# Patient Record
Sex: Female | Born: 1965 | Race: White | Hispanic: No | Marital: Married | Smoking: Never smoker
Health system: Southern US, Community
[De-identification: ages and names within clinical notes are randomized; demographics above are authoritative.]

## PROBLEM LIST (undated history)

## (undated) DIAGNOSIS — O009 Unspecified ectopic pregnancy without intrauterine pregnancy: Secondary | ICD-10-CM

## (undated) DIAGNOSIS — K439 Ventral hernia without obstruction or gangrene: Secondary | ICD-10-CM

## (undated) DIAGNOSIS — I10 Essential (primary) hypertension: Secondary | ICD-10-CM

## (undated) DIAGNOSIS — K219 Gastro-esophageal reflux disease without esophagitis: Secondary | ICD-10-CM

## (undated) DIAGNOSIS — R112 Nausea with vomiting, unspecified: Secondary | ICD-10-CM

## (undated) DIAGNOSIS — Z9889 Other specified postprocedural states: Secondary | ICD-10-CM

## (undated) DIAGNOSIS — K810 Acute cholecystitis: Secondary | ICD-10-CM

---

## 1987-07-07 DIAGNOSIS — O009 Unspecified ectopic pregnancy without intrauterine pregnancy: Secondary | ICD-10-CM

## 1987-07-07 HISTORY — DX: Unspecified ectopic pregnancy without intrauterine pregnancy: O00.90

## 1989-07-06 HISTORY — PX: ABDOMINOPLASTY: SUR9

## 1998-02-05 ENCOUNTER — Other Ambulatory Visit: Admission: RE | Admit: 1998-02-05 | Discharge: 1998-02-05 | Payer: Self-pay | Admitting: Obstetrics and Gynecology

## 1999-04-24 ENCOUNTER — Other Ambulatory Visit: Admission: RE | Admit: 1999-04-24 | Discharge: 1999-04-24 | Payer: Self-pay | Admitting: *Deleted

## 2000-04-28 ENCOUNTER — Encounter: Admission: RE | Admit: 2000-04-28 | Discharge: 2000-05-17 | Payer: Self-pay | Admitting: Family Medicine

## 2000-05-14 ENCOUNTER — Other Ambulatory Visit: Admission: RE | Admit: 2000-05-14 | Discharge: 2000-05-14 | Payer: Self-pay | Admitting: *Deleted

## 2001-05-26 ENCOUNTER — Other Ambulatory Visit: Admission: RE | Admit: 2001-05-26 | Discharge: 2001-05-26 | Payer: Self-pay | Admitting: *Deleted

## 2001-05-27 ENCOUNTER — Encounter: Admission: RE | Admit: 2001-05-27 | Discharge: 2001-05-27 | Payer: Self-pay | Admitting: Family Medicine

## 2001-05-27 ENCOUNTER — Encounter: Payer: Self-pay | Admitting: Family Medicine

## 2002-05-29 ENCOUNTER — Other Ambulatory Visit: Admission: RE | Admit: 2002-05-29 | Discharge: 2002-05-29 | Payer: Self-pay | Admitting: Obstetrics and Gynecology

## 2003-10-15 ENCOUNTER — Other Ambulatory Visit: Admission: RE | Admit: 2003-10-15 | Discharge: 2003-10-15 | Payer: Self-pay | Admitting: Obstetrics & Gynecology

## 2006-07-06 DIAGNOSIS — K439 Ventral hernia without obstruction or gangrene: Secondary | ICD-10-CM

## 2006-07-06 HISTORY — PX: VENTRAL HERNIA REPAIR: SHX424

## 2006-07-06 HISTORY — DX: Ventral hernia without obstruction or gangrene: K43.9

## 2006-07-07 ENCOUNTER — Encounter: Admission: RE | Admit: 2006-07-07 | Discharge: 2006-07-07 | Payer: Self-pay | Admitting: Obstetrics and Gynecology

## 2007-03-11 ENCOUNTER — Ambulatory Visit (HOSPITAL_COMMUNITY): Admission: RE | Admit: 2007-03-11 | Discharge: 2007-03-11 | Payer: Self-pay | Admitting: *Deleted

## 2009-06-21 ENCOUNTER — Encounter: Admission: RE | Admit: 2009-06-21 | Discharge: 2009-06-21 | Payer: Self-pay | Admitting: Obstetrics

## 2010-07-27 ENCOUNTER — Encounter: Payer: Self-pay | Admitting: Obstetrics

## 2010-11-18 NOTE — Op Note (Signed)
Wendy Yoder, Wendy Yoder                 ACCOUNT NO.:  [id number]   MEDICAL RECORD NO.:  [id number]          PATIENT TYPE:  AMB   LOCATION:  DAY                          FACILITY:  [facility]   PHYSICIAN:  Alfonse Ras, MD   DATE OF BIRTH:  [birth year]   DATE OF PROCEDURE:  03/11/2007  DATE OF DISCHARGE:                               OPERATIVE REPORT   PREOPERATIVE DIAGNOSIS:  Ventral hernia.   POSTOPERATIVE DIAGNOSIS:  Ventral hernia.   PROCEDURE:  Ventral hernia repair with mesh.   SURGEON:  Baruch Merl, MD   ANESTHESIA:  General.   DESCRIPTION:  The patient was taken to the operating room and placed in  a supine position.  After adequate general anesthesia was induced using  endotracheal tube, the abdomen was prepped and draped in a normal  sterile fashion.  Using a vertical upper midline incision, I dissected  down to the hernia sac; this was excised down to the fascia.  There was  a significant amount of omental contents which were reduced back into  the abdomen.  The fascial defect was identified, was only about 2-cm  long and was closed primarily with interrupted figure-of-eight #1  Surgilons.  A piece of 3 x 3 Prolene mesh was then placed over the  repair and tacked about 2 cm outside the repair without difficulty with  a running 2-0 Prolene suture.  The wound was copiously irrigated with  saline.  The skin was closed with a subcuticular 4-0 Monocryl.  Steri-  Strips and sterile dressings were applied.  The patient tolerated the  procedure well and went to PACU in good condition.      Alfonse Ras, MD  Electronically Signed     KRE/MEDQ  D:  03/11/2007  T:  03/11/2007  Job:  [id number]

## 2011-04-17 LAB — PREGNANCY, URINE: Preg Test, Ur: NEGATIVE

## 2011-10-25 ENCOUNTER — Ambulatory Visit: Payer: 59

## 2011-10-25 ENCOUNTER — Ambulatory Visit (INDEPENDENT_AMBULATORY_CARE_PROVIDER_SITE_OTHER): Payer: 59 | Admitting: Family Medicine

## 2011-10-25 VITALS — BP 108/73 | HR 80 | Temp 98.3°F | Resp 16 | Ht 63.25 in | Wt 201.2 lb

## 2011-10-25 DIAGNOSIS — R29898 Other symptoms and signs involving the musculoskeletal system: Secondary | ICD-10-CM

## 2011-10-25 DIAGNOSIS — S8990XA Unspecified injury of unspecified lower leg, initial encounter: Secondary | ICD-10-CM

## 2011-10-25 NOTE — Progress Notes (Signed)
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  Since that time, she has felt that her right leg might give out on her, particularly when she bends and stoops.  There is no back pain, sensory change, or lower leg tenderness, although the outside of the right heel is sore with foot dorsiflexion. Took aleve initially  O: NAD No bony abnormality Full knee, ankle and foot ROM Normal reflexes and sensory check to gross touch. Nontender anterior shin or malleoli. No ecchymosis. [facility] reading (PRIMARY) by  Dr. Milus Glazier:  tib fib and ankle on right: .neg A: right leg strain P:  Given exercises Offered PT. 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

## 2013-01-30 ENCOUNTER — Other Ambulatory Visit: Payer: Self-pay

## 2013-01-30 DIAGNOSIS — Z1231 Encounter for screening mammogram for malignant neoplasm of breast: Secondary | ICD-10-CM

## 2013-02-07 ENCOUNTER — Ambulatory Visit
Admission: RE | Admit: 2013-02-07 | Discharge: 2013-02-07 | Disposition: A | Discharge status: Home / Self Care | Payer: 59 | Source: Ambulatory Visit

## 2013-02-07 DIAGNOSIS — Z1231 Encounter for screening mammogram for malignant neoplasm of breast: Secondary | ICD-10-CM

## 2013-04-18 ENCOUNTER — Encounter (HOSPITAL_COMMUNITY): Payer: Self-pay | Admitting: Emergency Medicine

## 2013-04-18 ENCOUNTER — Observation Stay (HOSPITAL_COMMUNITY)
Admission: EM | Admit: 2013-04-18 | Discharge: 2013-04-20 | Disposition: A | Discharge status: Home / Self Care | Payer: 59 | Attending: General Surgery | Admitting: General Surgery

## 2013-04-18 ENCOUNTER — Emergency Department (HOSPITAL_COMMUNITY): Payer: 59

## 2013-04-18 ENCOUNTER — Ambulatory Visit (HOSPITAL_COMMUNITY): Payer: 59

## 2013-04-18 DIAGNOSIS — D72829 Elevated white blood cell count, unspecified: Secondary | ICD-10-CM

## 2013-04-18 DIAGNOSIS — K819 Cholecystitis, unspecified: Secondary | ICD-10-CM

## 2013-04-18 DIAGNOSIS — R1013 Epigastric pain: Secondary | ICD-10-CM

## 2013-04-18 DIAGNOSIS — R110 Nausea: Secondary | ICD-10-CM

## 2013-04-18 DIAGNOSIS — K810 Acute cholecystitis: Secondary | ICD-10-CM | POA: Diagnosis present

## 2013-04-18 DIAGNOSIS — K8020 Calculus of gallbladder without cholecystitis without obstruction: Principal | ICD-10-CM | POA: Insufficient documentation

## 2013-04-18 DIAGNOSIS — K8010 Calculus of gallbladder with chronic cholecystitis without obstruction: Secondary | ICD-10-CM | POA: Insufficient documentation

## 2013-04-18 DIAGNOSIS — Z23 Encounter for immunization: Secondary | ICD-10-CM | POA: Insufficient documentation

## 2013-04-18 HISTORY — DX: Acute cholecystitis: K81.0

## 2013-04-18 HISTORY — DX: Other specified postprocedural states: Z98.890

## 2013-04-18 HISTORY — DX: Ventral hernia without obstruction or gangrene: K43.9

## 2013-04-18 HISTORY — DX: Other specified postprocedural states: R11.2

## 2013-04-18 HISTORY — DX: Unspecified ectopic pregnancy without intrauterine pregnancy: O00.90

## 2013-04-18 LAB — CBC WITH DIFFERENTIAL/PLATELET
Eosinophils Relative: 0 % (ref 0–5)
HCT: 42.1 % (ref 36.0–46.0)
Hemoglobin: 14.1 g/dL (ref 12.0–15.0)
Lymphocytes Relative: 12 % (ref 12–46)
Lymphs Abs: 2.2 10*3/uL (ref 0.7–4.0)
MCV: 87.2 fL (ref 78.0–100.0)
Monocytes Relative: 4 % (ref 3–12)
Platelets: 341 10*3/uL (ref 150–400)
RBC: 4.83 MIL/uL (ref 3.87–5.11)
WBC: 19.4 10*3/uL — ABNORMAL HIGH (ref 4.0–10.5)

## 2013-04-18 LAB — URINALYSIS, ROUTINE W REFLEX MICROSCOPIC
Bilirubin Urine: NEGATIVE
Protein, ur: NEGATIVE mg/dL
Urobilinogen, UA: 0.2 mg/dL (ref 0.0–1.0)

## 2013-04-18 LAB — COMPREHENSIVE METABOLIC PANEL
ALT: 15 U/L (ref 0–35)
Alkaline Phosphatase: 102 U/L (ref 39–117)
BUN: 10 mg/dL (ref 6–23)
CO2: 24 mEq/L (ref 19–32)
Calcium: 9.7 mg/dL (ref 8.4–10.5)
GFR calc Af Amer: >90 mL/min (ref >90)
GFR calc non Af Amer: >90 mL/min (ref >90)
Glucose, Bld: 102 mg/dL — ABNORMAL HIGH (ref 70–99)
Sodium: 133 mEq/L — ABNORMAL LOW (ref 135–145)

## 2013-04-18 LAB — URINE MICROSCOPIC-ADD ON

## 2013-04-18 MED ORDER — IOHEXOL 300 MG/ML  SOLN
50.0000 mL | Freq: Once | INTRAMUSCULAR | Status: AC | PRN
  Administered 2013-04-18: 50 mL via ORAL

## 2013-04-18 MED ORDER — MORPHINE SULFATE 2 MG/ML IJ SOLN
2.0000 mg | Freq: Once | INTRAMUSCULAR | Status: AC
  Administered 2013-04-18: 2 mg via INTRAVENOUS

## 2013-04-18 MED ORDER — PIPERACILLIN-TAZOBACTAM 3.375 G IVPB
3.3750 g | Freq: Three times a day (TID) | INTRAVENOUS | Status: DC
  Administered 2013-04-18 – 2013-04-20 (×5): 3.375 g via INTRAVENOUS
  Filled 2013-04-18 (×7): qty 50

## 2013-04-18 MED ORDER — POTASSIUM CHLORIDE IN NACL 20-0.9 MEQ/L-% IV SOLN
INTRAVENOUS | Status: DC
  Administered 2013-04-18 – 2013-04-19 (×3): via INTRAVENOUS
  Filled 2013-04-18 (×5): qty 1000

## 2013-04-18 MED ORDER — ONDANSETRON HCL 4 MG/2ML IJ SOLN
4.0000 mg | Freq: Once | INTRAMUSCULAR | Status: AC
  Administered 2013-04-18: 4 mg via INTRAVENOUS
  Filled 2013-04-18: qty 2

## 2013-04-18 MED ORDER — DIPHENHYDRAMINE HCL 50 MG/ML IJ SOLN: 12.5000 mg | Freq: Four times a day (QID) | INTRAMUSCULAR | Status: DC | PRN

## 2013-04-18 MED ORDER — ACETAMINOPHEN 650 MG RE SUPP: 650.0000 mg | Freq: Four times a day (QID) | RECTAL | Status: DC | PRN

## 2013-04-18 MED ORDER — ONDANSETRON HCL 4 MG/2ML IJ SOLN
4.0000 mg | Freq: Four times a day (QID) | INTRAMUSCULAR | Status: DC | PRN
  Administered 2013-04-18 – 2013-04-19 (×2): 4 mg via INTRAVENOUS
  Filled 2013-04-18: qty 2

## 2013-04-18 MED ORDER — DIPHENHYDRAMINE HCL 12.5 MG/5ML PO ELIX: 12.5000 mg | ORAL_SOLUTION | Freq: Four times a day (QID) | ORAL | Status: DC | PRN

## 2013-04-18 MED ORDER — IOHEXOL 300 MG/ML  SOLN
100.0000 mL | Freq: Once | INTRAMUSCULAR | Status: AC | PRN
  Administered 2013-04-18: 100 mL via INTRAVENOUS

## 2013-04-18 MED ORDER — ACETAMINOPHEN 325 MG PO TABS: 650.0000 mg | ORAL_TABLET | Freq: Four times a day (QID) | ORAL | Status: DC | PRN

## 2013-04-18 MED ORDER — SODIUM CHLORIDE 0.9 % IV BOLUS (SEPSIS)
1000.0000 mL | Freq: Once | INTRAVENOUS | Status: AC
  Administered 2013-04-18: 1000 mL via INTRAVENOUS

## 2013-04-18 MED ORDER — INFLUENZA VAC SPLIT QUAD 0.5 ML IM SUSP
0.5000 mL | INTRAMUSCULAR | Status: AC
  Filled 2013-04-18 (×2): qty 0.5

## 2013-04-18 MED ORDER — MORPHINE SULFATE 2 MG/ML IJ SOLN
1.0000 mg | INTRAMUSCULAR | Status: DC | PRN
  Administered 2013-04-18 (×3): 4 mg via INTRAVENOUS
  Filled 2013-04-18 (×3): qty 2

## 2013-04-18 MED ORDER — MORPHINE SULFATE 4 MG/ML IJ SOLN
4.0000 mg | Freq: Once | INTRAMUSCULAR | Status: AC
  Administered 2013-04-18: 4 mg via INTRAVENOUS
  Filled 2013-04-18: qty 1

## 2013-04-18 MED ORDER — MORPHINE SULFATE 4 MG/ML IJ SOLN: 6.0000 mg | Freq: Once | INTRAMUSCULAR | Status: DC

## 2013-04-18 MED ORDER — PIPERACILLIN-TAZOBACTAM 3.375 G IVPB
3.3750 g | Freq: Once | INTRAVENOUS | Status: AC
  Administered 2013-04-18: 3.375 g via INTRAVENOUS
  Filled 2013-04-18: qty 50

## 2013-04-18 MED ORDER — GI COCKTAIL ~~LOC~~
30.0000 mL | Freq: Once | ORAL | Status: AC
  Administered 2013-04-18: 30 mL via ORAL
  Filled 2013-04-18: qty 30

## 2013-04-18 MED ORDER — VITAMINS A & D EX OINT
TOPICAL_OINTMENT | CUTANEOUS | Status: AC
  Administered 2013-04-18: 17:00:00
  Filled 2013-04-18: qty 5

## 2013-04-18 MED ORDER — MORPHINE SULFATE 4 MG/ML IJ SOLN
6.0000 mg | Freq: Once | INTRAMUSCULAR | Status: AC
  Administered 2013-04-18: 4 mg via INTRAVENOUS
  Filled 2013-04-18: qty 2

## 2013-04-18 NOTE — Progress Notes (Signed)
   CARE MANAGEMENT ED NOTE 04/18/2013  Patient:  COREY, CAULFIELD   Account Number:  [id number]  Date Initiated:  04/18/2013  Documentation initiated by:  Edd Arbour  Subjective/Objective Assessment:   47 yr old female [facility] pt without pcp listed in EPIC     Subjective/Objective Assessment Detail:     Action/Plan:   Action/Plan Detail:   Cm spoke with pt who states pcp previously Dr [facility] but not sure whom she has been assigned to since this female dr left   Anticipated DC Date:  04/18/2013     Status Recommendation to Physician:   Result of Recommendation:    Other ED Services  Consult Working Plan    DC Planning Services  Other  Outpatient Services - Pt will follow up  PCP issues    Choice offered to / List presented to:            Status of service:  Completed, signed off  ED Comments:   ED Comments Detail:

## 2013-04-18 NOTE — Consult Note (Signed)
Wendy Yoder 1965-07-30  [id number].    Requesting MD: Dr. Patria Mane Chief Complaint/Reason for Consult: Epigastric abdominal pain & nausea HPI:  47 y/o caucasian obese female presents to [facility] with complaints of severe epigastric abdominal pain and nausea since 04/17/13 night.  The pain became was a severe burning pain and radiated to her mid back which is why she presented to the ED.  Notes feeling a little under the weather about a week ago with some minor nausea.  Appetite has been normal, normal BM's.  No other accompanying symptoms including no vomiting/diarrhea, CP/SOB, dizziness, blood in stools.  Noted no relation to food, had stuffed peppers yesterday for dinner without any problems.  Noted only alleviating factor was standing in the warm shower.  Denies excessive NSAID, caffeine, or alcohol use.  No dysphagia, regurgitation, chronic sore throat, hoarseness.  PSH includes ventral hernia repair in 2008 by Dr. Colin Benton, c-section in 1989 for an ectopic pregnancy, abdominoplasty 1991.    CT scan shows abnormal appearance of the GB with GB wall thickening and/or pericholecystic fluid, questionable gallstones.  Enlarged CBD without CBD stones.  Minimal intrahepatic biliary duct prominence.  Mid transverse colon in ventral abdominal hernia without obstruction.  Ultrasound shows gallstones, no evidence of cholecystitis, but dilated CBD.  Her WBC is 19.2, but all of her other labs including LFT's and lipase are normal.  Urine pregnancy was negative.   ROS: All systems reviewed and otherwise negative except for as above  History reviewed. No pertinent family history.  Past Medical History  Diagnosis Date  . Ventral hernia 2008  . Ectopic pregnancy 1989    Past Surgical History  Procedure Laterality Date  . Ventral hernia repair  2008    Dr. Seward Grater  . Cesarean section  1989  . Abdominoplasty  1991    Social History:  reports that she has never smoked. She does not have any smokeless tobacco  history on file. She reports that she does not drink alcohol. Her drug history is not on file.  Allergies:  Allergies  Allergen Reactions  . Latex Rash    Patient said she had a rash that entered her blood stream   . Tape Rash    Patient said she had a rash that entered her blood stream       (Not in a hospital admission)  Blood pressure 131/68, pulse 48, temperature 98 F (36.7 C), temperature source Oral, resp. rate 14, last menstrual period 02/16/2013, SpO2 99.00%. Physical Exam: General: pleasant, obese, WD/WN white female who is laying in bed in NAD HEENT: head is normocephalic, atraumatic.  Sclera are noninjected.  PERRL.  Ears and nose without any masses or lesions.  Mouth is pink and moist Heart: regular, rate, and rhythm.  No obvious murmurs, gallops, or rubs noted.  Palpable pedal pulses bilaterally Lungs: CTAB, no wheezes, rhonchi, or rales noted.  Respiratory effort nonlabored Abd: obese, soft, mild tenderness in the epigastriuim, no tenderness in the any other region of the abdomen, +BS, ventral hernia defect palpated in the upper abdomen under the midline abdominal scar which is nontender, low transverse scar well healed, no masses, or organomegaly MS: all 4 extremities are symmetrical with no cyanosis, clubbing, or edema. Skin: warm and dry with no masses, lesions, or rashes Psych: A&Ox3 with an appropriate affect.  Results for orders placed during the hospital encounter of 04/18/13 (from the past 48 hour(s))  CBC WITH DIFFERENTIAL     Status: Abnormal   Collection Time  04/18/13  8:50 AM      Result Value Range   WBC 19.4 (*) 4.0 - 10.5 K/uL   RBC 4.83  3.87 - 5.11 MIL/uL   Hemoglobin 14.1  12.0 - 15.0 g/dL   HCT 40.9  81.1 - 91.4 %   MCV 87.2  78.0 - 100.0 fL   MCH 29.2  26.0 - 34.0 pg   MCHC 33.5  30.0 - 36.0 g/dL   RDW 78.2  95.6 - 21.3 %   Platelets 341  150 - 400 K/uL   Neutrophils Relative % 84 (*) 43 - 77 %   Neutro Abs 16.3 (*) 1.7 - 7.7 K/uL    Lymphocytes Relative 12  12 - 46 %   Lymphs Abs 2.2  0.7 - 4.0 K/uL   Monocytes Relative 4  3 - 12 %   Monocytes Absolute 0.8  0.1 - 1.0 K/uL   Eosinophils Relative 0  0 - 5 %   Eosinophils Absolute 0.1  0.0 - 0.7 K/uL   Basophils Relative 0  0 - 1 %   Basophils Absolute 0.1  0.0 - 0.1 K/uL  COMPREHENSIVE METABOLIC PANEL     Status: Abnormal   Collection Time    04/18/13  8:50 AM      Result Value Range   Sodium 133 (*) 135 - 145 mEq/L   Potassium 3.7  3.5 - 5.1 mEq/L   Chloride 98  96 - 112 mEq/L   CO2 24  19 - 32 mEq/L   Glucose, Bld 102 (*) 70 - 99 mg/dL   BUN 10  6 - 23 mg/dL   Creatinine, Ser 0.86  0.50 - 1.10 mg/dL   Calcium 9.7  8.4 - 57.8 mg/dL   Total Protein 8.4 (*) 6.0 - 8.3 g/dL   Albumin 3.7  3.5 - 5.2 g/dL   AST 19  0 - 37 U/L   ALT 15  0 - 35 U/L   Alkaline Phosphatase 102  39 - 117 U/L   Total Bilirubin 0.2 (*) 0.3 - 1.2 mg/dL   GFR calc non Af Amer >90  >90 mL/min   GFR calc Af Amer >90  >90 mL/min   Comment: (NOTE)     The eGFR has been calculated using the CKD EPI equation.     This calculation has not been validated in all clinical situations.     eGFR's persistently <90 mL/min signify possible Chronic Kidney     Disease.  LIPASE, BLOOD     Status: None   Collection Time    04/18/13  8:50 AM      Result Value Range   Lipase 21  11 - 59 U/L  URINALYSIS, ROUTINE W REFLEX MICROSCOPIC     Status: Abnormal   Collection Time    04/18/13 11:36 AM      Result Value Range   Color, Urine YELLOW  YELLOW   APPearance CLEAR  CLEAR   Specific Gravity, Urine 1.029  1.005 - 1.030   pH 5.5  5.0 - 8.0   Glucose, UA NEGATIVE  NEGATIVE mg/dL   Hgb urine dipstick MODERATE (*) NEGATIVE   Bilirubin Urine NEGATIVE  NEGATIVE   Ketones, ur 15 (*) NEGATIVE mg/dL   Protein, ur NEGATIVE  NEGATIVE mg/dL   Urobilinogen, UA 0.2  0.0 - 1.0 mg/dL   Nitrite NEGATIVE  NEGATIVE   Leukocytes, UA SMALL (*) NEGATIVE  URINE MICROSCOPIC-ADD ON     Status: Abnormal   Collection Time  04/18/13 11:36 AM      Result Value Range   Squamous Epithelial / LPF FEW (*) RARE   WBC, UA 3-6  <3 WBC/hpf   RBC / HPF 3-6  <3 RBC/hpf   Bacteria, UA MANY (*) RARE   Casts GRANULAR CAST (*) NEGATIVE  POCT PREGNANCY, URINE     Status: None   Collection Time    04/18/13 11:41 AM      Result Value Range   Preg Test, Ur NEGATIVE  NEGATIVE   Comment:            THE SENSITIVITY OF THIS     METHODOLOGY IS >24 mIU/mL   Ct Abdomen Pelvis W Contrast  04/18/2013   CLINICAL DATA:  Peri umbilical pain started last night. History of hernia repair 4 years ago. Prior an ectopic pregnancy 25 years ago.  EXAM: CT ABDOMEN AND PELVIS WITH CONTRAST  TECHNIQUE: Multidetector CT imaging of the abdomen and pelvis was performed using the standard protocol following bolus administration of intravenous contrast.  CONTRAST:  50mL OMNIPAQUE IOHEXOL 300 MG/ML SOLN, OMNIPAQUE IOHEXOL 300 MG/ML SOLN  COMPARISON:  None.  FINDINGS: Lung bases clear.  Abnormal appearance of the gallbladder with gallbladder wall thickening and/or pericholecystic fluid. Suggestion of gallstones. Cholecystitis is a possibility. Ultrasound may be considered for further delineation.  Enlarged common bile duct without calcified common bile duct stone noted nor evidence of pancreatic obstructing lesion.  Minimal intrahepatic biliary duct prominence. Within the left lobe of the liver, there is a 9 mm structure most suggestive of a cyst. Liver is elongated spanning over 22 cm.  The mid transverse colon enters anterior abdominal wall hernia. No obstruction proximal to this level. No extra luminal bowel inflammatory process or free intraperitoneal air.  Non contrast filled slightly underdistended views of the urinary bladder unremarkable. Small follicle/cyst right ovary incidentally noted.  Mild degenerative changes lower lumbar spine. Mild sacroiliac joint degenerative changes.  No abdominal aortic aneurysm. No adenopathy.  IMPRESSION: Abnormal  appearance of the gallbladder with gallbladder wall thickening and/or pericholecystic fluid. Suggestion of gallstones. Cholecystitis is a possibility. Ultrasound may be considered for further delineation.  Enlarged common bile duct without calcified common bile duct stone noted nor evidence of pancreatic obstructing lesion.  Minimal intrahepatic biliary duct prominence. Within the left lobe of the liver, there is a 9 mm structure most suggestive of a cyst. Liver is elongated spanning over 22 cm.  Mid transverse colon enters anterior abdominal wall hernia. No obstruction proximal to this level. No extra luminal bowel inflammatory process or free intraperitoneal air.  These results were called by telephone at the time of interpretation on 04/18/2013 at 1:17 PM to Dr. Patria Mane , who verbally acknowledged these results.   Electronically Signed   By: Bridgett Larsson M.D.   On: 04/18/2013 13:19       Assessment/Plan Epigastric abdominal pain Cholelithiasis Nausea Leukocytosis 19.4 Recurrent ventral hernia s/p repair with mesh in 2008  Plan: 1.  Her clinical picture is not particularly consistent with acute cholecystitis, but she does have GB wall thickening/pericholecystic fluid as evident on CT scan and leukocytosis.  Differential diagnosis could include pancreatitis, however lipase is negative.  LFT's are also normal.  PUD could also be a possibility, but she doesn't have any significant risk factors for it.   2.  US shows stones, but no cholecystitis, will obtain HIDA scan first thing in the AM 3.  Cont NPO, IVF, pain control, antiemetics, antibiotics (zosyn given) 4.  Will admit to our service for further workup and pain control   DORT, MEGAN 04/18/2013, 2:21 PM Pager: [phone]  Somewhat inconsistent exam and findings. Plan HIDA scan to look for cystic duct obstruction. Agree with above.  Ovidio Kin, MD, [facility] Pager: [phone] Office phone:  [phone]

## 2013-04-18 NOTE — ED Notes (Signed)
US at bedside

## 2013-04-18 NOTE — ED Notes (Addendum)
Patient transported to CT 

## 2013-04-18 NOTE — ED Notes (Addendum)
Pt from home c/o of abdominal pain above the umbilicus started last pm.  Hx of Hiatal hernia 4 yrs ago. which was repaired mesh. Pt states the pain radiates to her back and its a burning pain. Hx of ectopic pregnancy 25 yrs ago. She has not period since August. No N/V/D.

## 2013-04-18 NOTE — ED Provider Notes (Signed)
CSN: [id number]     Arrival date & time 04/18/13  4098 History   First MD Initiated Contact with Patient 04/18/13 [phone]     Chief Complaint  Patient presents with  . Abdominal Pain  . Back Pain    HPI Patient reports developing severe epigastric discomfort with radiation through to her back which developed today.  She's had nausea without vomiting.  She denies diarrhea.  No prior history of biliary symptoms.  No prior diagnosis of cholelithiasis.  She denies fevers or chills.  No urinary complaints.  She does not routinely drink alcohol.  She's never had pancreatitis before.  She's otherwise healthy without any significant prior medical issues.  She does have a known ventral hernia which was repaired with mesh.   Past Medical History  Diagnosis Date  . Ventral hernia 2008  . Ectopic pregnancy 1989  . PONV (postoperative nausea and vomiting)    Past Surgical History  Procedure Laterality Date  . Ventral hernia repair  2008    Dr. Seward Grater  . Cesarean section  1989  . Abdominoplasty  1991   History reviewed. No pertinent family history. History  Substance Use Topics  . Smoking status: Never Smoker   . Smokeless tobacco: Never Used  . Alcohol Use: No   OB History   Grav Para Term Preterm Abortions TAB SAB Ect Mult Living   3 1             Review of Systems  All other systems reviewed and are negative.    Allergies  Latex and Tape  Home Medications   Current Outpatient Rx  Name  Route  Sig  Dispense  Refill  . ibuprofen (ADVIL,MOTRIN) 200 MG tablet   Oral   Take 400 mg by mouth every 6 (six) hours as needed for pain.         . norgestimate-ethinyl estradiol (ORTHO-CYCLEN,SPRINTEC,PREVIFEM) 0.25-35 MG-MCG tablet   Oral   Take 1 tablet by mouth daily.         . sodium-potassium bicarbonate (ALKA-SELTZER GOLD) TBEF dissolvable tablet   Oral   Take 1 tablet by mouth daily as needed (heartburn).          BP 150/76  Pulse 64  Temp(Src) 98 F (36.7 C) (Oral)   Resp 16  SpO2 97%  LMP 02/16/2013 Physical Exam  Nursing note and vitals reviewed. Constitutional: She is oriented to person, place, and time. She appears well-developed and well-nourished. No distress.  HENT:  Head: Normocephalic and atraumatic.  Eyes: EOM are normal.  Neck: Normal range of motion.  Cardiovascular: Normal rate, regular rhythm and normal heart sounds.   Pulmonary/Chest: Effort normal and breath sounds normal.  Abdominal: Soft. She exhibits no distension.  Epigastric tenderness without guarding or rebound  Musculoskeletal: Normal range of motion.  Neurological: She is alert and oriented to person, place, and time.  Skin: Skin is warm and dry.  Psychiatric: She has a normal mood and affect. Judgment normal.    ED Course  Procedures (including critical care time) Labs Review Labs Reviewed  CBC WITH DIFFERENTIAL - Abnormal; Notable for the following:    WBC 19.4 (*)    Neutrophils Relative % 84 (*)    Neutro Abs 16.3 (*)    All other components within normal limits  COMPREHENSIVE METABOLIC PANEL - Abnormal; Notable for the following:    Sodium 133 (*)    Glucose, Bld 102 (*)    Total Protein 8.4 (*)    Total  Bilirubin 0.2 (*)    All other components within normal limits  URINALYSIS, ROUTINE W REFLEX MICROSCOPIC - Abnormal; Notable for the following:    Hgb urine dipstick MODERATE (*)    Ketones, ur 15 (*)    Leukocytes, UA SMALL (*)    All other components within normal limits  URINE MICROSCOPIC-ADD ON - Abnormal; Notable for the following:    Squamous Epithelial / LPF FEW (*)    Bacteria, UA MANY (*)    Casts GRANULAR CAST (*)    All other components within normal limits  URINE CULTURE  LIPASE, BLOOD  POCT PREGNANCY, URINE   Ct Abdomen Pelvis W Contrast  04/18/2013   CLINICAL DATA:  Peri umbilical pain started last night. History of hernia repair 4 years ago. Prior an ectopic pregnancy 25 years ago.  EXAM: CT ABDOMEN AND PELVIS WITH CONTRAST   TECHNIQUE: Multidetector CT imaging of the abdomen and pelvis was performed using the standard protocol following bolus administration of intravenous contrast.  CONTRAST:  50mL OMNIPAQUE IOHEXOL 300 MG/ML SOLN, OMNIPAQUE IOHEXOL 300 MG/ML SOLN  COMPARISON:  None.  FINDINGS: Lung bases clear.  Abnormal appearance of the gallbladder with gallbladder wall thickening and/or pericholecystic fluid. Suggestion of gallstones. Cholecystitis is a possibility. Ultrasound may be considered for further delineation.  Enlarged common bile duct without calcified common bile duct stone noted nor evidence of pancreatic obstructing lesion.  Minimal intrahepatic biliary duct prominence. Within the left lobe of the liver, there is a 9 mm structure most suggestive of a cyst. Liver is elongated spanning over 22 cm.  The mid transverse colon enters anterior abdominal wall hernia. No obstruction proximal to this level. No extra luminal bowel inflammatory process or free intraperitoneal air.  Non contrast filled slightly underdistended views of the urinary bladder unremarkable. Small follicle/cyst right ovary incidentally noted.  Mild degenerative changes lower lumbar spine. Mild sacroiliac joint degenerative changes.  No abdominal aortic aneurysm. No adenopathy.  IMPRESSION: Abnormal appearance of the gallbladder with gallbladder wall thickening and/or pericholecystic fluid. Suggestion of gallstones. Cholecystitis is a possibility. Ultrasound may be considered for further delineation.  Enlarged common bile duct without calcified common bile duct stone noted nor evidence of pancreatic obstructing lesion.  Minimal intrahepatic biliary duct prominence. Within the left lobe of the liver, there is a 9 mm structure most suggestive of a cyst. Liver is elongated spanning over 22 cm.  Mid transverse colon enters anterior abdominal wall hernia. No obstruction proximal to this level. No extra luminal bowel inflammatory process or free  intraperitoneal air.  These results were called by telephone at the time of interpretation on 04/18/2013 at 1:17 PM to Dr. Patria Mane , who verbally acknowledged these results.   Electronically Signed   By: Bridgett Larsson M.D.   On: 04/18/2013 13:19    EKG Interpretation   None       MDM   1. Cholecystitis    CT scan with questionable cholecystitis.  Elevated white blood cell count.  IV Zosyn given.  General surgery consultation.  Ultrasound pending at this time    Lyanne Co, MD 04/18/13 [phone]

## 2013-04-18 NOTE — ED Notes (Signed)
Nuclear med called and reports pt needs to be Opoid free for 6 hrs last Opiod admin @  [phone]

## 2013-04-18 NOTE — ED Notes (Signed)
Pt in CT.

## 2013-04-18 NOTE — Progress Notes (Signed)
Pt admitted from ED and resting in bed comfortably with husband at bedside.

## 2013-04-19 ENCOUNTER — Encounter (HOSPITAL_COMMUNITY): Payer: Self-pay

## 2013-04-19 ENCOUNTER — Observation Stay (HOSPITAL_COMMUNITY): Payer: 59 | Admitting: Anesthesiology

## 2013-04-19 ENCOUNTER — Observation Stay (HOSPITAL_COMMUNITY): Payer: 59

## 2013-04-19 ENCOUNTER — Encounter (HOSPITAL_COMMUNITY): Payer: 59 | Admitting: Anesthesiology

## 2013-04-19 ENCOUNTER — Encounter (HOSPITAL_COMMUNITY)
Admission: EM | Disposition: A | Discharge status: Home / Self Care | Payer: Self-pay | Source: Home / Self Care | Attending: Emergency Medicine

## 2013-04-19 DIAGNOSIS — K8010 Calculus of gallbladder with chronic cholecystitis without obstruction: Secondary | ICD-10-CM

## 2013-04-19 HISTORY — PX: CHOLECYSTECTOMY: SHX55

## 2013-04-19 LAB — COMPREHENSIVE METABOLIC PANEL
AST: 16 U/L (ref 0–37)
Albumin: 3.1 g/dL — ABNORMAL LOW (ref 3.5–5.2)
Alkaline Phosphatase: 93 U/L (ref 39–117)
CO2: 27 mEq/L (ref 19–32)
Chloride: 100 mEq/L (ref 96–112)
Creatinine, Ser: 0.9 mg/dL (ref 0.50–1.10)
GFR calc non Af Amer: 77 mL/min — ABNORMAL LOW (ref >90)
Glucose, Bld: 108 mg/dL — ABNORMAL HIGH (ref 70–99)
Potassium: 4.3 mEq/L (ref 3.5–5.1)
Total Bilirubin: 0.4 mg/dL (ref 0.3–1.2)

## 2013-04-19 LAB — CBC
MCH: 29.1 pg (ref 26.0–34.0)
MCHC: 33.1 g/dL (ref 30.0–36.0)
MCV: 88.2 fL (ref 78.0–100.0)
Platelets: 291 10*3/uL (ref 150–400)
RBC: 4.22 MIL/uL (ref 3.87–5.11)
RDW: 14.4 % (ref 11.5–15.5)

## 2013-04-19 LAB — SURGICAL PCR SCREEN
MRSA, PCR: NEGATIVE
Staphylococcus aureus: NEGATIVE

## 2013-04-19 LAB — URINE CULTURE: Colony Count: 5000

## 2013-04-19 LAB — LIPASE, BLOOD: Lipase: 26 U/L (ref 11–59)

## 2013-04-19 SURGERY — LAPAROSCOPIC CHOLECYSTECTOMY WITH INTRAOPERATIVE CHOLANGIOGRAM
Anesthesia: General | Site: Abdomen | Wound class: Clean Contaminated

## 2013-04-19 MED ORDER — PHENOL 1.4 % MT LIQD
1.0000 | OROMUCOSAL | Status: DC | PRN
  Administered 2013-04-19: 1 via OROMUCOSAL
  Filled 2013-04-19: qty 177

## 2013-04-19 MED ORDER — HYDROCODONE-ACETAMINOPHEN 5-325 MG PO TABS
1.0000 | ORAL_TABLET | ORAL | Status: DC | PRN
  Administered 2013-04-20: 2 via ORAL
  Filled 2013-04-19: qty 2

## 2013-04-19 MED ORDER — ONDANSETRON HCL 4 MG/2ML IJ SOLN
INTRAMUSCULAR | Status: AC
  Administered 2013-04-19: 4 mg via INTRAVENOUS
  Filled 2013-04-19: qty 2

## 2013-04-19 MED ORDER — PROMETHAZINE HCL 25 MG/ML IJ SOLN: 6.2500 mg | INTRAMUSCULAR | Status: DC | PRN

## 2013-04-19 MED ORDER — ONDANSETRON HCL 4 MG/2ML IJ SOLN
INTRAMUSCULAR | Status: DC | PRN
  Administered 2013-04-19 (×2): 2 mg via INTRAMUSCULAR

## 2013-04-19 MED ORDER — LIDOCAINE HCL (CARDIAC) 20 MG/ML IV SOLN
INTRAVENOUS | Status: DC | PRN
  Administered 2013-04-19: 75 mg via INTRAVENOUS

## 2013-04-19 MED ORDER — GLYCOPYRROLATE 0.2 MG/ML IJ SOLN
INTRAMUSCULAR | Status: DC | PRN
  Administered 2013-04-19: .4 mg via INTRAVENOUS
  Administered 2013-04-19: 0.2 mg via INTRAVENOUS

## 2013-04-19 MED ORDER — CISATRACURIUM BESYLATE (PF) 10 MG/5ML IV SOLN
INTRAVENOUS | Status: DC | PRN
  Administered 2013-04-19: 6 mg via INTRAVENOUS
  Administered 2013-04-19: 2 mg via INTRAVENOUS

## 2013-04-19 MED ORDER — MIDAZOLAM HCL 5 MG/5ML IJ SOLN
INTRAMUSCULAR | Status: DC | PRN
  Administered 2013-04-19 (×2): 1 mg via INTRAVENOUS

## 2013-04-19 MED ORDER — BUPIVACAINE HCL (PF) 0.25 % IJ SOLN
INTRAMUSCULAR | Status: DC | PRN
  Administered 2013-04-19: 10 mL

## 2013-04-19 MED ORDER — KETOROLAC TROMETHAMINE 30 MG/ML IJ SOLN: 15.0000 mg | Freq: Once | INTRAMUSCULAR | Status: DC | PRN

## 2013-04-19 MED ORDER — HYDROMORPHONE HCL PF 1 MG/ML IJ SOLN: 0.2500 mg | INTRAMUSCULAR | Status: DC | PRN

## 2013-04-19 MED ORDER — DIATRIZOATE MEGLUMINE 30 % UR SOLN
URETHRAL | Status: DC | PRN
  Administered 2013-04-19: 7 mL via URETHRAL

## 2013-04-19 MED ORDER — HYDROMORPHONE HCL PF 1 MG/ML IJ SOLN
INTRAMUSCULAR | Status: DC | PRN
  Administered 2013-04-19 (×2): 1 mg via INTRAVENOUS

## 2013-04-19 MED ORDER — TECHNETIUM TC 99M MEBROFENIN IV KIT
5.1000 | PACK | Freq: Once | INTRAVENOUS | Status: AC | PRN
  Administered 2013-04-19: 5.1 via INTRAVENOUS

## 2013-04-19 MED ORDER — LACTATED RINGERS IV SOLN
INTRAVENOUS | Status: DC | PRN
  Administered 2013-04-19: 17:00:00
  Administered 2013-04-19 (×2): via INTRAVENOUS

## 2013-04-19 MED ORDER — SUCCINYLCHOLINE CHLORIDE 20 MG/ML IJ SOLN
INTRAMUSCULAR | Status: DC | PRN
  Administered 2013-04-19: 120 mg via INTRAVENOUS

## 2013-04-19 MED ORDER — PROPOFOL 10 MG/ML IV BOLUS
INTRAVENOUS | Status: DC | PRN
  Administered 2013-04-19: 200 mg via INTRAVENOUS

## 2013-04-19 MED ORDER — PROPOFOL INFUSION 10 MG/ML OPTIME
INTRAVENOUS | Status: DC | PRN
  Administered 2013-04-19: 100 ug/kg/min via INTRAVENOUS

## 2013-04-19 MED ORDER — SCOPOLAMINE 1 MG/3DAYS TD PT72
MEDICATED_PATCH | TRANSDERMAL | Status: AC
  Filled 2013-04-19: qty 1

## 2013-04-19 MED ORDER — DEXAMETHASONE SODIUM PHOSPHATE 10 MG/ML IJ SOLN
INTRAMUSCULAR | Status: DC | PRN
  Administered 2013-04-19: 10 mg via INTRAVENOUS

## 2013-04-19 MED ORDER — LACTATED RINGERS IV SOLN: INTRAVENOUS | Status: DC

## 2013-04-19 MED ORDER — SCOPOLAMINE 1 MG/3DAYS TD PT72
MEDICATED_PATCH | TRANSDERMAL | Status: DC | PRN
  Administered 2013-04-19: 1 via TRANSDERMAL

## 2013-04-19 MED ORDER — MORPHINE SULFATE 4 MG/ML IJ SOLN
INTRAMUSCULAR | Status: AC
  Administered 2013-04-19: 4 mg via INTRAVENOUS
  Filled 2013-04-19: qty 1

## 2013-04-19 MED ORDER — BUPIVACAINE HCL (PF) 0.25 % IJ SOLN
INTRAMUSCULAR | Status: AC
  Filled 2013-04-19: qty 30

## 2013-04-19 MED ORDER — ACETAMINOPHEN 10 MG/ML IV SOLN
1000.0000 mg | Freq: Once | INTRAVENOUS | Status: AC
  Administered 2013-04-19: 1000 mg via INTRAVENOUS
  Filled 2013-04-19: qty 100

## 2013-04-19 MED ORDER — MORPHINE SULFATE 4 MG/ML IJ SOLN
4.0000 mg | Freq: Once | INTRAMUSCULAR | Status: AC
  Administered 2013-04-19: 4 mg via INTRAVENOUS

## 2013-04-19 MED ORDER — FENTANYL CITRATE 0.05 MG/ML IJ SOLN
INTRAMUSCULAR | Status: DC | PRN
  Administered 2013-04-19 (×3): 50 ug via INTRAVENOUS

## 2013-04-19 MED ORDER — NEOSTIGMINE METHYLSULFATE 1 MG/ML IJ SOLN
INTRAMUSCULAR | Status: DC | PRN
  Administered 2013-04-19: 2 mg via INTRAVENOUS

## 2013-04-19 SURGICAL SUPPLY — 49 items
ADH SKN CLS APL DERMABOND .7 (GAUZE/BANDAGES/DRESSINGS)
APL SKNCLS STERI-STRIP NONHPOA (GAUZE/BANDAGES/DRESSINGS) ×1
APPLIER CLIP ROT 10 11.4 M/L (STAPLE) ×2
APR CLP MED LRG 11.4X10 (STAPLE) ×1
BAG SPEC RTRVL LRG 6X4 10 (ENDOMECHANICALS) ×1
BENZOIN TINCTURE PRP APPL 2/3 (GAUZE/BANDAGES/DRESSINGS) ×2 IMPLANT
CANISTER SUCTION 2500CC (MISCELLANEOUS) ×2 IMPLANT
CHLORAPREP W/TINT 26ML (MISCELLANEOUS) ×2 IMPLANT
CHOLANGIOGRAM CATH TAUT (CATHETERS) ×2 IMPLANT
CLIP APPLIE ROT 10 11.4 M/L (STAPLE) ×1 IMPLANT
CLOTH BEACON ORANGE TIMEOUT ST (SAFETY) ×2 IMPLANT
COVER MAYO STAND STRL (DRAPES) ×1 IMPLANT
DECANTER SPIKE VIAL GLASS SM (MISCELLANEOUS) ×2 IMPLANT
DERMABOND ADVANCED (GAUZE/BANDAGES/DRESSINGS)
DERMABOND ADVANCED .7 DNX12 (GAUZE/BANDAGES/DRESSINGS) IMPLANT
DRAPE C-ARM 42X120 X-RAY (DRAPES) IMPLANT
DRAPE LAPAROSCOPIC ABDOMINAL (DRAPES) ×2 IMPLANT
ELECT REM PT RETURN 9FT ADLT (ELECTROSURGICAL) ×2
ELECTRODE REM PT RTRN 9FT ADLT (ELECTROSURGICAL) ×1 IMPLANT
GLOVE BIOGEL PI IND STRL 6.5 (GLOVE) IMPLANT
GLOVE BIOGEL PI IND STRL 7.0 (GLOVE) ×1 IMPLANT
GLOVE BIOGEL PI IND STRL 7.5 (GLOVE) IMPLANT
GLOVE BIOGEL PI IND STRL 8 (GLOVE) IMPLANT
GLOVE BIOGEL PI INDICATOR 6.5 (GLOVE) ×1
GLOVE BIOGEL PI INDICATOR 7.0 (GLOVE) ×4
GLOVE BIOGEL PI INDICATOR 7.5 (GLOVE) ×1
GLOVE BIOGEL PI INDICATOR 8 (GLOVE) ×1
GLOVE SURG SIGNA 7.5 PF LTX (GLOVE) ×1 IMPLANT
GOWN PREVENTION PLUS LG XLONG (DISPOSABLE) ×2 IMPLANT
GOWN STRL REIN XL XLG (GOWN DISPOSABLE) ×4 IMPLANT
HEMOSTAT SURGICEL 4X8 (HEMOSTASIS) IMPLANT
IV CATH 14GX2 1/4 (CATHETERS) ×2 IMPLANT
IV SET EXT 30 76VOL 4 MALE LL (IV SETS) ×2 IMPLANT
KIT BASIN OR (CUSTOM PROCEDURE TRAY) ×2 IMPLANT
NS IRRIG 1000ML POUR BTL (IV SOLUTION) ×1 IMPLANT
POUCH SPECIMEN RETRIEVAL 10MM (ENDOMECHANICALS) ×1 IMPLANT
SET IRRIG TUBING LAPAROSCOPIC (IRRIGATION / IRRIGATOR) ×2 IMPLANT
SOLUTION ANTI FOG 6CC (MISCELLANEOUS) ×2 IMPLANT
STOPCOCK K 69 2C6206 (IV SETS) ×2 IMPLANT
STRIP CLOSURE SKIN 1/4X4 (GAUZE/BANDAGES/DRESSINGS) ×2 IMPLANT
SUT VIC AB 5-0 PS2 18 (SUTURE) ×2 IMPLANT
TOWEL OR 17X26 10 PK STRL BLUE (TOWEL DISPOSABLE) ×6 IMPLANT
TRAY LAP CHOLE (CUSTOM PROCEDURE TRAY) ×2 IMPLANT
TROCAR BLADELESS OPT 5 100 (ENDOMECHANICALS) ×6 IMPLANT
TROCAR XCEL BLUNT TIP 100MML (ENDOMECHANICALS) ×2 IMPLANT
TROCAR XCEL NON-BLD 11X100MML (ENDOMECHANICALS) ×2 IMPLANT
TROCAR XCEL UNIV SLVE 11M 100M (ENDOMECHANICALS) IMPLANT
TUBING INSUFFLATION 10FT LAP (TUBING) ×2 IMPLANT
WATER STERILE IRR 1500ML POUR (IV SOLUTION) ×2 IMPLANT

## 2013-04-19 NOTE — Anesthesia Preprocedure Evaluation (Signed)
Anesthesia Evaluation  Patient identified by MRN, date of birth, ID band Patient awake    Reviewed: Allergy & Precautions, H&P , NPO status , Patient's Chart, lab work & pertinent test results  Airway Mallampati: II TM Distance: <3 FB Neck ROM: Full    Dental no notable dental hx.    Pulmonary neg pulmonary ROS,  breath sounds clear to auscultation  Pulmonary exam normal       Cardiovascular negative cardio ROS  Rhythm:Regular Rate:Normal     Neuro/Psych negative neurological ROS  negative psych ROS   GI/Hepatic negative GI ROS, Neg liver ROS,   Endo/Other  Morbid obesity  Renal/GU negative Renal ROS  negative genitourinary   Musculoskeletal negative musculoskeletal ROS (+)   Abdominal   Peds negative pediatric ROS (+)  Hematology negative hematology ROS (+)   Anesthesia Other Findings   Reproductive/Obstetrics negative OB ROS                           Anesthesia Physical Anesthesia Plan  ASA: II  Anesthesia Plan: General   Post-op Pain Management:    Induction: Intravenous  Airway Management Planned: Oral ETT  Additional Equipment:   Intra-op Plan:   Post-operative Plan: Extubation in OR  Informed Consent: I have reviewed the patients History and Physical, chart, labs and discussed the procedure including the risks, benefits and alternatives for the proposed anesthesia with the patient or authorized representative who has indicated his/her understanding and acceptance.   Dental advisory given  Plan Discussed with: CRNA and Surgeon  Anesthesia Plan Comments:         Anesthesia Quick Evaluation  

## 2013-04-19 NOTE — Anesthesia Procedure Notes (Signed)
Procedure Name: Intubation Date/Time: 04/19/2013 2:52 PM Performed by: Edison Pace Pre-anesthesia Checklist: Emergency Drugs available, Patient identified, Timeout performed, Suction available and Patient being monitored Patient Re-evaluated:Patient Re-evaluated prior to inductionOxygen Delivery Method: Circle system utilized Preoxygenation: Pre-oxygenation with 100% oxygen Intubation Type: IV induction and Rapid sequence Laryngoscope Size: Mac and 3 Grade View: Grade II Tube type: Oral Tube size: 7.5 mm Number of attempts: 1 Airway Equipment and Method: Stylet Placement Confirmation: ETT inserted through vocal cords under direct vision,  positive ETCO2 and breath sounds checked- equal and bilateral Secured at: 21 cm Tube secured with: Tape Dental Injury: Teeth and Oropharynx as per pre-operative assessment

## 2013-04-19 NOTE — Progress Notes (Signed)
Clinical Social Work  CSW received referral to assist with advanced directives. CSW attempted to meet with patient but patient out of room for a procedure. CSW will follow up at later time.  [place], [place] [phone]

## 2013-04-19 NOTE — Op Note (Signed)
04/18/2013 - 04/19/2013  4:21 PM  PATIENT:  Wendy Yoder, 47 y.o., female, MRN: [id number]  PREOP DIAGNOSIS:  gallstones  POSTOP DIAGNOSIS:   Acute cholecystitis, cholelithiasis, impacted gall stone  PROCEDURE:   Procedure(s): LAPAROSCOPIC CHOLECYSTECTOMY WITH INTRAOPERATIVE CHOLANGIOGRAM  SURGEON:   Ovidio Kin, M.D.  ASSISTANTGordy Savers, M.D.  ANESTHESIA:   general  Anesthesiologist: Eilene Ghazi, MD CRNA: Edison Pace, CRNA  General  ASA: @asa @  EBL:  minimal  ml  BLOOD ADMINISTERED: none  DRAINS: none   LOCAL MEDICATIONS USED:   30 cc 1/4% marcaine  SPECIMEN:   Gall bladder  COUNTS CORRECT:  YES  INDICATIONS FOR PROCEDURE:  Wendy Yoder is a 47 y.o. (DOB: 1965-11-13) white  female whose primary care physician is Provider Not In System and comes for cholecystectomy.   The indications and risks of the gall bladder surgery were explained to the patient.  The risks include, but are not limited to, infection, bleeding, common bile duct injury and open surgery.  SURGERY:  The patient was taken to room #[facility] at [facility].  The abdomen was prepped with chloroprep.  The patient was already on Zosyn.   A time out was held and the surgical checklist run.   An infraumbilical incision was made into the abdominal cavity.  A 12 mm Hasson trocar was inserted into the abdominal cavity through the infraumbilical incision and secured with a 0 Vicryl suture.  Three additional trocars were inserted: a 10 mm trocar in the sub-xiphoid location, a 5 mm trocar in the right mid subcostal area, and a 5 mm trocar in the right lateral subcostal area.   The abdomen was explored and the liver, stomach, and bowel that could be seen were unremarkable.  The patient had a hernia in her upper abdomen with apparent colon going into the hernia.  I left the hernia and the involved bowel alone.  The gall bladder was distended.  I attempted to decompress the gall bladder with the Nizhat sucker, the most of  the gall bladder distention was due to gall stones.   The gall bladder was identified, grasped, and rotated cephalad.  Disssection was carried down to the gall bladder/cystic duct junction and the cystic duct isolated.  A clip was placed on the gall bladder side of the cystic duct.   An intra-operative cholangiogram was shot.   The intra-operative cholangiogram was shot using a cut off Taut catheter placed through a 14 gauge angiocath in the RUQ.  The Taut catheter was inserted in the cut cystic duct and secured with an endoclip.  A cholangiogram was shot with 8 cc of 1/2 strength Omnipaque.  Using fluoroscopy, the cholangiogram showed the flow of contrast into the common bile duct, up the hepatic radicals, and into the duodenum.  The CBD was slightly enlarged.  There was no mass or obstruction.  This was a normal intra-operative cholangiogram.   The Taut catheter was removed.  The cystic duct was tripley endoclipped and the cystic artery was identified and clipped.  The gall bladder was bluntly and sharpley dissected from the gall bladder bed.   After the gall bladder was removed from the liver, the gall bladder bed and Triangle of [place] were inspected.  There was no bleeding or bile leak.  The gall bladder was placed in a endocatch bag and delivered through the umbilicus.  The abdomen was irrigated with 1,500 cc saline.   The trocars were then removed.  I infiltrated 30 cc  of 1/4% Marcaine into the incisions.  The umbilical port closed with a 0 Vicryl suture and the skin closed with 5-0 monocryl.  The skin was painted with Dermabond.  The patient's sponge and needle count were correct.  The patient was transported to the RR in good condition.  Ovidio Kin, MD, [facility] Surgery Pager: [phone] Office phone:  [phone]

## 2013-04-19 NOTE — Transfer of Care (Signed)
Immediate Anesthesia Transfer of Care Note  Patient: Wendy Yoder  Procedure(s) Performed: Procedure(s): LAPAROSCOPIC CHOLECYSTECTOMY WITH INTRAOPERATIVE CHOLANGIOGRAM (N/A)  Patient Location: PACU  Anesthesia Type:General  Level of Consciousness: awake, oriented, patient cooperative, lethargic and responds to stimulation  Airway & Oxygen Therapy: Patient Spontanous Breathing and Patient connected to face mask oxygen  Post-op Assessment: Report given to PACU RN, Post -op Vital signs reviewed and stable and Patient moving all extremities  Post vital signs: Reviewed and stable  Complications: No apparent anesthesia complications

## 2013-04-19 NOTE — Preoperative (Signed)
Beta Blockers   Reason not to administer Beta Blockers:Not Applicable 

## 2013-04-19 NOTE — Progress Notes (Signed)
Subjective: She is going down for HIDA scan now.  She says she feels much better this AM  Objective: Vital signs in last 24 hours: Temp:  [97.8 F (36.6 C)-98.2 F (36.8 C)] 98 F (36.7 C) (10/15 0520) Pulse Rate:  [47-70] 70 (10/15 0520) Resp:  [13-20] 18 (10/15 0520) BP: (123-156)/(68-84) 124/75 mmHg (10/15 0520) SpO2:  [95 %-100 %] 95 % (10/15 0520) Weight:  [102.286 kg (225 lb 8 oz)] 102.286 kg (225 lb 8 oz) (10/14 1630)  NPO Afebrile, VSS, WBC is better, CMP is showing normal LFT/lipase.  Intake/Output from previous day: 10/14 0701 - 10/15 0700 In: 1523.3 [I.V.:1423.3; IV Piggyback:100] Out: 1 [Urine:1] Intake/Output this shift:    Abdomen benign.  Lab Results:   Recent Labs  04/18/13 0850 04/19/13 0655  WBC 19.4* 13.7*  HGB 14.1 12.3  HCT 42.1 37.2  PLT 341 291    BMET  Recent Labs  04/18/13 0850 04/19/13 0655  NA 133* 133*  K 3.7 4.3  CL 98 100  CO2 24 27  GLUCOSE 102* 108*  BUN 10 8  CREATININE 0.77 0.90  CALCIUM 9.7 9.0   PT/INR No results found for this basename: LABPROT, INR,  in the last 72 hours   Recent Labs Lab 04/18/13 0850 04/19/13 0655  AST 19 16  ALT 15 14  ALKPHOS 102 93  BILITOT 0.2* 0.4  PROT 8.4* 7.2  ALBUMIN 3.7 3.1*     Lipase     Component Value Date/Time   LIPASE 26 04/19/2013 0655     Studies/Results: US Abdomen Complete  04/18/2013   CLINICAL DATA:  Right upper quadrant pain  EXAM: ULTRASOUND ABDOMEN COMPLETE  COMPARISON:  CT abdomen and pelvis of 04/18/2013  FINDINGS: Gallbladder  The gallbladder is visualized and there are gallstones present in the gallbladder. However the gallbladder wall is not thickened and no pain is present over the gallbladder with compression.  Common bile duct  Diameter: The common bile duct is dilated measuring 9.1 mm in diameter. A distal common bile duct calculus, stricture, or mass would be the primary considerations.  Liver  The liver has a normal echogenic pattern. No focal  abnormality is seen.  IVC  No abnormality is visualized.  Pancreas  Visualized portion is unremarkable.  Spleen  The spleen is normal measuring 7.3 cm sagittally.  Right Kidney  Length: The right kidney measures 10.6 cm. No hydronephrosis is seen.  Left Kidney  Length: The left kidney measures 12 1 cm. No hydronephrosis is seen.  Abdominal aorta  The abdominal aorta is normal caliber.  IMPRESSION: 1. Gallstones. No ultrasound evidence of acute cholecystitis is seen however . 2. Dilated common bile duct suspicious for distal common bile duct calculus, stricture, or mass.   Electronically Signed   By: Dwyane Dee M.D.   On: 04/18/2013 15:08   Ct Abdomen Pelvis W Contrast  04/18/2013   CLINICAL DATA:  Peri umbilical pain started last night. History of hernia repair 4 years ago. Prior an ectopic pregnancy 25 years ago.  EXAM: CT ABDOMEN AND PELVIS WITH CONTRAST  TECHNIQUE: Multidetector CT imaging of the abdomen and pelvis was performed using the standard protocol following bolus administration of intravenous contrast.  CONTRAST:  50mL OMNIPAQUE IOHEXOL 300 MG/ML SOLN, OMNIPAQUE IOHEXOL 300 MG/ML SOLN  COMPARISON:  None.  FINDINGS: Lung bases clear.  Abnormal appearance of the gallbladder with gallbladder wall thickening and/or pericholecystic fluid. Suggestion of gallstones. Cholecystitis is a possibility. Ultrasound may be considered for  further delineation.  Enlarged common bile duct without calcified common bile duct stone noted nor evidence of pancreatic obstructing lesion.  Minimal intrahepatic biliary duct prominence. Within the left lobe of the liver, there is a 9 mm structure most suggestive of a cyst. Liver is elongated spanning over 22 cm.  The mid transverse colon enters anterior abdominal wall hernia. No obstruction proximal to this level. No extra luminal bowel inflammatory process or free intraperitoneal air.  Non contrast filled slightly underdistended views of the urinary bladder unremarkable.  Small follicle/cyst right ovary incidentally noted.  Mild degenerative changes lower lumbar spine. Mild sacroiliac joint degenerative changes.  No abdominal aortic aneurysm. No adenopathy.  IMPRESSION: Abnormal appearance of the gallbladder with gallbladder wall thickening and/or pericholecystic fluid. Suggestion of gallstones. Cholecystitis is a possibility. Ultrasound may be considered for further delineation.  Enlarged common bile duct without calcified common bile duct stone noted nor evidence of pancreatic obstructing lesion.  Minimal intrahepatic biliary duct prominence. Within the left lobe of the liver, there is a 9 mm structure most suggestive of a cyst. Liver is elongated spanning over 22 cm.  Mid transverse colon enters anterior abdominal wall hernia. No obstruction proximal to this level. No extra luminal bowel inflammatory process or free intraperitoneal air.  These results were called by telephone at the time of interpretation on 04/18/2013 at 1:17 PM to Dr. Patria Mane , who verbally acknowledged these results.   Electronically Signed   By: Bridgett Larsson M.D.   On: 04/18/2013 13:19    Medications: . influenza vac split quadrivalent PF  0.5 mL Intramuscular Tomorrow-1000  . piperacillin-tazobactam (ZOSYN)  IV  3.375 g Intravenous Q8H    Assessment/Plan Epigastric abdominal pain & nausea Cholelithiasis  Nausea  Leukocytosis 19.4  Recurrent ventral hernia s/p repair with mesh in 2008  PLan:  Hida scan   LOS: 1 day    JENNINGS,WILLARD 04/19/2013  HIDA scan shows cystic duct obstruction.   The patient is entirely asymptomatic right now.   She is very anxious about having surgery because of prior problems with her C section (for twins) and her ventral hernia repair.  Interestingly, several people in her husband's family has had their gall bladder removed, so he is in favor of going ahead with surgery.  I think that she is best served with proceeding with surgery, but it is not urgent and  we could feed her and see how she does.  I discussed with the patient and her husband the indications and risks of gall bladder surgery.  The primary risks of gall bladder surgery include, but are not limited to, bleeding, infection, common bile duct injury, and open surgery.  There is also the risk that the patient may have continued symptoms after surgery.  However, the likelihood of improvement in symptoms and return to the patient's normal status is good. We discussed the typical post-operative recovery course. I tried to answer the patient's questions.  The patient has decided to go ahead with surgery.  Will proceed.  Ovidio Kin, MD, [facility] Surgery Pager: [phone] Office phone:  [phone]

## 2013-04-20 ENCOUNTER — Encounter (HOSPITAL_COMMUNITY): Payer: Self-pay | Admitting: General Surgery

## 2013-04-20 DIAGNOSIS — K810 Acute cholecystitis: Secondary | ICD-10-CM

## 2013-04-20 HISTORY — DX: Acute cholecystitis: K81.0

## 2013-04-20 MED ORDER — ACETAMINOPHEN 325 MG PO TABS: 650.0000 mg | ORAL_TABLET | Freq: Four times a day (QID) | ORAL | Status: AC | PRN

## 2013-04-20 MED ORDER — HYDROCODONE-ACETAMINOPHEN 5-325 MG PO TABS: 1.0000 | ORAL_TABLET | ORAL | Status: DC | PRN

## 2013-04-20 MED ORDER — IBUPROFEN 800 MG PO TABS: 400.0000 mg | ORAL_TABLET | Freq: Four times a day (QID) | ORAL | Status: DC | PRN

## 2013-04-20 MED ORDER — INFLUENZA VAC SPLIT QUAD 0.5 ML IM SUSP
0.5000 mL | Freq: Once | INTRAMUSCULAR | Status: AC
  Administered 2013-04-20: 0.5 mL via INTRAMUSCULAR
  Filled 2013-04-20: qty 0.5

## 2013-04-20 NOTE — Progress Notes (Addendum)
1 Day Post-Op  Subjective: Feels better in bed, hasn't gone beyond bathroom or clear liquids.    Objective: Vital signs in last 24 hours: Temp:  [98.1 F (36.7 C)-98.6 F (37 C)] 98.1 F (36.7 C) (10/16 0620) Pulse Rate:  [50-82] 50 (10/16 0620) Resp:  [11-18] 18 (10/16 0620) BP: (102-128)/(55-87) 103/55 mmHg (10/16 0620) SpO2:  [95 %-100 %] 95 % (10/16 0620)  Full liquid diet Afebrile, VSS, No labs IOC- No evidence of retained stone or obstruction.     Intake/Output from previous day: 10/15 0701 - 10/16 0700 In: 4783.3 [I.V.:4683.3; IV Piggyback:100] Out: -  Intake/Output this shift: Total I/O In: 1283.3 [I.V.:1183.3; IV Piggyback:100] Out: -   General appearance: alert GI: soft, some brusing at port site.  Incisions look good. Still tender.  Lab Results:   Recent Labs  04/18/13 0850 04/19/13 0655  WBC 19.4* 13.7*  HGB 14.1 12.3  HCT 42.1 37.2  PLT 341 291    BMET  Recent Labs  04/18/13 0850 04/19/13 0655  NA 133* 133*  K 3.7 4.3  CL 98 100  CO2 24 27  GLUCOSE 102* 108*  BUN 10 8  CREATININE 0.77 0.90  CALCIUM 9.7 9.0   PT/INR No results found for this basename: LABPROT, INR,  in the last 72 hours   Recent Labs Lab 04/18/13 0850 04/19/13 0655  AST 19 16  ALT 15 14  ALKPHOS 102 93  BILITOT 0.2* 0.4  PROT 8.4* 7.2  ALBUMIN 3.7 3.1*     Lipase     Component Value Date/Time   LIPASE 26 04/19/2013 0655     Studies/Results: Dg Cholangiogram Operative  04/19/2013   CLINICAL DATA:  Gallstones.  Laparoscopic cholecystectomy.  EXAM: INTRAOPERATIVE CHOLANGIOGRAM  TECHNIQUE: Cholangiographic images from the C-arm fluoroscopic device were submitted for interpretation post-operatively. Please see the procedural report for the amount of contrast and the fluoroscopy time utilized.  COMPARISON:  Ultrasound 04/18/2013  FINDINGS: Mild prominence of the common bile duct without visible filling defect to suggest retained stone. Contrast passes into  the small bowel.  IMPRESSION: No evidence of retained stone or obstruction.   Electronically Signed   By: Charlett Nose M.D.   On: 04/19/2013 16:18   Nm Hepatobiliary  04/19/2013   CLINICAL DATA:  Gallstones with nausea and abdominal pain.  EXAM: NUCLEAR MEDICINE HEPATOBILIARY IMAGING  TECHNIQUE: Sequential images of the abdomen were obtained out to 60 minutes following intravenous administration of radiopharmaceutical.  COMPARISON:  Abdominal ultrasound and abdomen /pelvis CT from yesterday  RADIOPHARMACEUTICALS:  5.70mCi Tc-75m Choletec  FINDINGS: There is brisk uptake of radiotracer by the hepatic parenchyma. Blood pool activity is cleared by 5 min. Biliary activity is seen by 10 min. Common duct and gut activity is also visible by 10 min. After 60 min of observation, no gallbladder filling was observed.  Patient was given 4.0 mg of intravenous morphine sulfate prior to additional imaging.  After administration of intravenous morphine, there is continued excretion of radiotracer by the liver. After an additional 60 min of observation, no gallbladder filling is observed.  IMPRESSION: No gallbladder filling, even after administration of intravenous morphine sulfate. Scintigraphic features are consistent with acute cholecystitis.  I discussed these findings by telephone with Dr. Ezzard Standing at approximately 11:37 a.m. on 04/19/2013.   Electronically Signed   By: Kennith Center M.D.   On: 04/19/2013 11:40   US Abdomen Complete  04/18/2013   CLINICAL DATA:  Right upper quadrant pain  EXAM: ULTRASOUND ABDOMEN  COMPLETE  COMPARISON:  CT abdomen and pelvis of 04/18/2013  FINDINGS: Gallbladder  The gallbladder is visualized and there are gallstones present in the gallbladder. However the gallbladder wall is not thickened and no pain is present over the gallbladder with compression.  Common bile duct  Diameter: The common bile duct is dilated measuring 9.1 mm in diameter. A distal common bile duct calculus, stricture, or  mass would be the primary considerations.  Liver  The liver has a normal echogenic pattern. No focal abnormality is seen.  IVC  No abnormality is visualized.  Pancreas  Visualized portion is unremarkable.  Spleen  The spleen is normal measuring 7.3 cm sagittally.  Right Kidney  Length: The right kidney measures 10.6 cm. No hydronephrosis is seen.  Left Kidney  Length: The left kidney measures 12 1 cm. No hydronephrosis is seen.  Abdominal aorta  The abdominal aorta is normal caliber.  IMPRESSION: 1. Gallstones. No ultrasound evidence of acute cholecystitis is seen however . 2. Dilated common bile duct suspicious for distal common bile duct calculus, stricture, or mass.   Electronically Signed   By: Dwyane Dee M.D.   On: 04/18/2013 15:08   Ct Abdomen Pelvis W Contrast  04/18/2013   CLINICAL DATA:  Peri umbilical pain started last night. History of hernia repair 4 years ago. Prior an ectopic pregnancy 25 years ago.  EXAM: CT ABDOMEN AND PELVIS WITH CONTRAST  TECHNIQUE: Multidetector CT imaging of the abdomen and pelvis was performed using the standard protocol following bolus administration of intravenous contrast.  CONTRAST:  50mL OMNIPAQUE IOHEXOL 300 MG/ML SOLN, OMNIPAQUE IOHEXOL 300 MG/ML SOLN  COMPARISON:  None.  FINDINGS: Lung bases clear.  Abnormal appearance of the gallbladder with gallbladder wall thickening and/or pericholecystic fluid. Suggestion of gallstones. Cholecystitis is a possibility. Ultrasound may be considered for further delineation.  Enlarged common bile duct without calcified common bile duct stone noted nor evidence of pancreatic obstructing lesion.  Minimal intrahepatic biliary duct prominence. Within the left lobe of the liver, there is a 9 mm structure most suggestive of a cyst. Liver is elongated spanning over 22 cm.  The mid transverse colon enters anterior abdominal wall hernia. No obstruction proximal to this level. No extra luminal bowel inflammatory process or free  intraperitoneal air.  Non contrast filled slightly underdistended views of the urinary bladder unremarkable. Small follicle/cyst right ovary incidentally noted.  Mild degenerative changes lower lumbar spine. Mild sacroiliac joint degenerative changes.  No abdominal aortic aneurysm. No adenopathy.  IMPRESSION: Abnormal appearance of the gallbladder with gallbladder wall thickening and/or pericholecystic fluid. Suggestion of gallstones. Cholecystitis is a possibility. Ultrasound may be considered for further delineation.  Enlarged common bile duct without calcified common bile duct stone noted nor evidence of pancreatic obstructing lesion.  Minimal intrahepatic biliary duct prominence. Within the left lobe of the liver, there is a 9 mm structure most suggestive of a cyst. Liver is elongated spanning over 22 cm.  Mid transverse colon enters anterior abdominal wall hernia. No obstruction proximal to this level. No extra luminal bowel inflammatory process or free intraperitoneal air.  These results were called by telephone at the time of interpretation on 04/18/2013 at 1:17 PM to Dr. Patria Mane , who verbally acknowledged these results.   Electronically Signed   By: Bridgett Larsson M.D.   On: 04/18/2013 13:19    Medications: . influenza vac split quadrivalent PF  0.5 mL Intramuscular Tomorrow-1000  . piperacillin-tazobactam (ZOSYN)  IV  3.375 g Intravenous Q8H  Assessment/Plan Acute cholecystitis, cholelithiasis, impacted gall stone  Hx of ventral hernia/abdominoplasty    Plan:  Mobilize, advance diet, home later today if she does well.   LOS: 2 days    JENNINGS,WILLARD 04/20/2013  Agree with above. Did much better than she has in the past with anesthesia/surgery. Looks good today. To go home. 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  Her husband is also a Clinical research associate, works with patents in pharmacology.  Ovidio Kin, MD, [facility] Surgery Pager:  [phone] Office phone:  [phone]

## 2013-04-20 NOTE — Progress Notes (Signed)
Patient admitted to 5E from ED, alert and oriented, transferred by hospital bed, patient tolerated well, family members at bedside, c/o pain in abdomen, rated on scale of 1-10, rated 8, skin dry and intact, pupil reactive to light pen, BP=>150/76, P=>64, temp=>97.8, SATs=>97% on RA, patient in stable condition at this time

## 2013-04-20 NOTE — Discharge Summary (Signed)
Physician Discharge Summary  Patient ID: Wendy Yoder MRN: [id number] DOB/AGE: Jan 02, 1966 47 y.o.  Admit date: 04/18/2013 Discharge date: 04/20/2013  Admission Diagnoses:  Epigastric abdominal pain  Cholelithiasis  Nausea  Leukocytosis 19.4  Recurrent ventral hernia s/p repair with mesh in 2008  Discharge Diagnoses:  Acute cholecystitis, cholelithiasis, impacted gall stone  Hx of ventral hernia/abdominoplasty  Principal Problem:   Cholecystitis, acute   PROCEDURES: LAPAROSCOPIC CHOLECYSTECTOMY WITH INTRAOPERATIVE CHOLANGIOGRAM 04/19/2013, Kandis Cocking, MD.  Hospital Course: 47 y/o caucasian obese female presents to [facility] with complaints of severe epigastric abdominal pain and nausea since 04/17/13 night. The pain became was a severe burning pain and radiated to her mid back which is why she presented to the ED. Notes feeling a little under the weather about a week ago with some minor nausea. Appetite has been normal, normal BM's. No other accompanying symptoms including no vomiting/diarrhea, CP/SOB, dizziness, blood in stools. Noted no relation to food, had stuffed peppers yesterday for dinner without any problems. Noted only alleviating factor was standing in the warm shower. Denies excessive NSAID, caffeine, or alcohol use. No dysphagia, regurgitation, chronic sore throat, hoarseness. PSH includes ventral hernia repair in 2008 by Dr. Colin Benton, c-section in 1989 for an ectopic pregnancy, abdominoplasty 1991.  CT scan shows abnormal appearance of the GB with GB wall thickening and/or pericholecystic fluid, questionable gallstones. Enlarged CBD without CBD stones. Minimal intrahepatic biliary duct prominence. Mid transverse colon in ventral abdominal hernia without obstruction. Ultrasound shows gallstones, no evidence of cholecystitis, but dilated CBD. Her WBC is 19.2, but all of her other labs including LFT's and lipase are normal. Urine pregnancy was negative.  She was admitted and started  on anitbiotics, she had a HIDA scan the next AM.  This showed: No gallbladder filling, even after administration of intravenous morphine sulfate. Scintigraphic features are consistent with acute cholecystitis.  She was taken to the OR the same afternoon.    She tolerated the procedure well. She usually has trouble with nausea/vomiting after surgery, but did well with this.  We advanced her diet and activity and she was ready to go home after lunch. . 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  Her husband is a Clinical research associate who works for patents with [employer].    Medication List         acetaminophen 325 MG tablet  Commonly known as:  TYLENOL  Take 2 tablets (650 mg total) by mouth every 6 (six) hours as needed for pain or fever (Do not take more than 4000 mg per day.  This is in your prescribed pain pill.).     HYDROcodone-acetaminophen 5-325 MG per tablet  Commonly known as:  NORCO/VICODIN  Take 1-2 tablets by mouth every 4 (four) hours as needed.     ibuprofen 200 MG tablet  Commonly known as:  ADVIL,MOTRIN  Take 400 mg by mouth every 6 (six) hours as needed for pain.     norgestimate-ethinyl estradiol 0.25-35 MG-MCG tablet  Commonly known as:  ORTHO-CYCLEN,SPRINTEC,PREVIFEM  Take 1 tablet by mouth daily.     sodium-potassium bicarbonate Tbef dissolvable tablet  Commonly known as:  ALKA-SELTZER GOLD  Take 1 tablet by mouth daily as needed (heartburn).       Follow-up Information   Follow up with [facility] H, MD. Schedule an appointment as soon as possible for a visit in 2 weeks. (Call for an  appointment in 2-3 weeks.)    Specialty:  General Surgery   Contact information:   [address] [place] [address] [phone]       Signed: Sherrie George 04/20/2013, 1:29 PM  Agree with above.  Ovidio Kin, MD, [facility]  Surgery Pager: [phone] Office phone:  [phone]

## 2013-05-01 NOTE — Anesthesia Postprocedure Evaluation (Signed)
  Anesthesia Post-op Note  Patient: Wendy Yoder  Procedure(s) Performed: Procedure(s) (LRB): LAPAROSCOPIC CHOLECYSTECTOMY WITH INTRAOPERATIVE CHOLANGIOGRAM (N/A)  Patient Location: PACU  Anesthesia Type: General  Level of Consciousness: awake and alert   Airway and Oxygen Therapy: Patient Spontanous Breathing  Post-op Pain: mild  Post-op Assessment: Post-op Vital signs reviewed, Patient's Cardiovascular Status Stable, Respiratory Function Stable, Patent Airway and No signs of Nausea or vomiting  Last Vitals:  Filed Vitals:   04/20/13 1057  BP:   Pulse:   Temp: 36.7 C  Resp:     Post-op Vital Signs: stable   Complications: No apparent anesthesia complications

## 2014-05-07 ENCOUNTER — Encounter (HOSPITAL_COMMUNITY): Payer: Self-pay | Admitting: General Surgery

## 2015-04-11 IMAGING — CT CT ABD-PELV W/ CM
1 of 3 series · 13 of 32 positions shown, 18 images · IV contrast (OMNIPAQUE 300)
Comparison: None.

CLINICAL DATA: Peri umbilical pain started last night. History of
hernia repair 4 years ago. Prior an ectopic pregnancy 25 years ago.

EXAM:
CT ABDOMEN AND PELVIS WITH CONTRAST
TECHNIQUE: Multidetector CT imaging of the abdomen and pelvis was performed
using the standard protocol following bolus administration of
intravenous contrast.
CONTRAST:  50mL OMNIPAQUE IOHEXOL 300 MG/ML SOLN, 100mL OMNIPAQUE
IOHEXOL 300 MG/ML SOLN

[Series 2: abd/pel with · axial · 0.87mm/px · z∈[+634,+1039]mm · 13 of 93 slices shown, 18 images]
[im 6/93  soft-tissue]
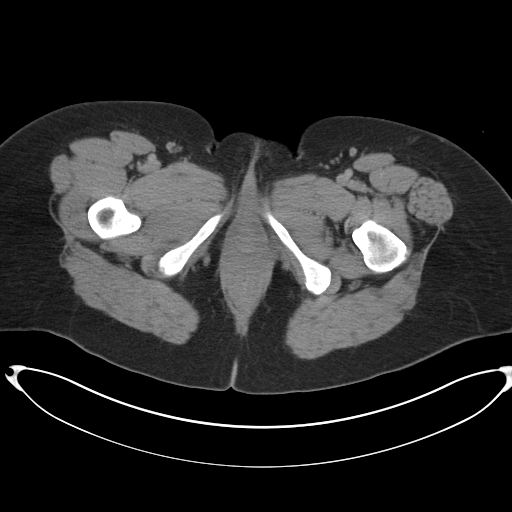
[im 6/93  bone]
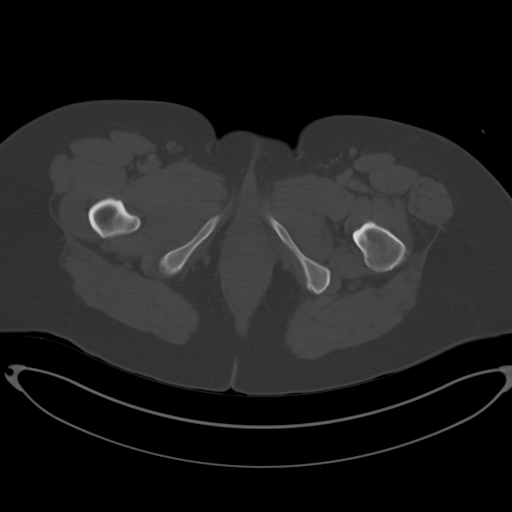
[im 16/93  soft-tissue]
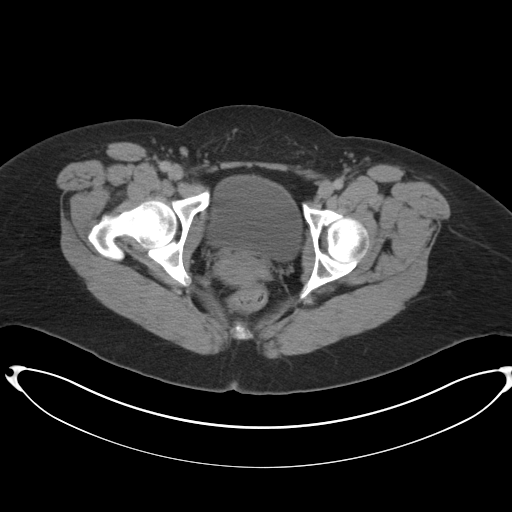
[im 21/93  soft-tissue]
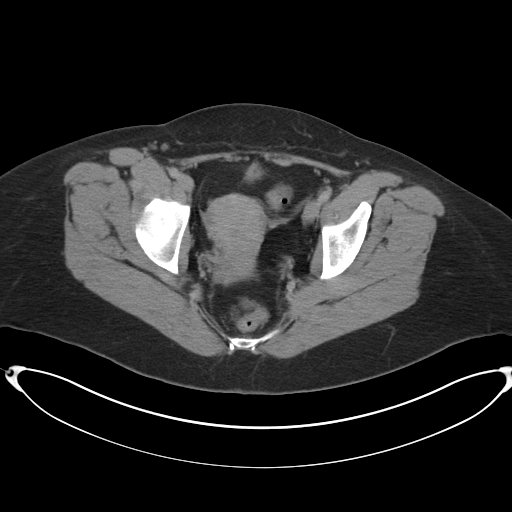
[im 26/93  soft-tissue]
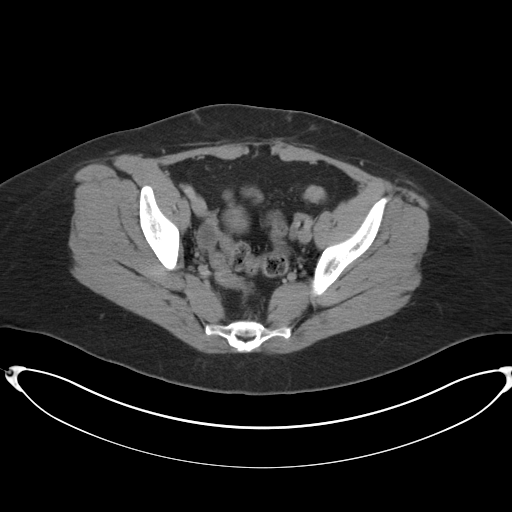
[im 36/93  soft-tissue]
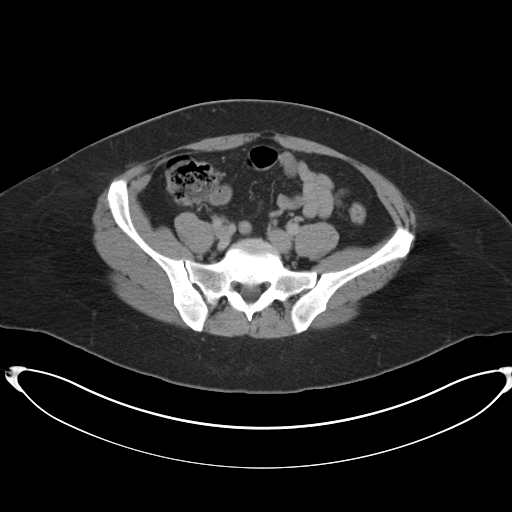
[im 41/93  soft-tissue]
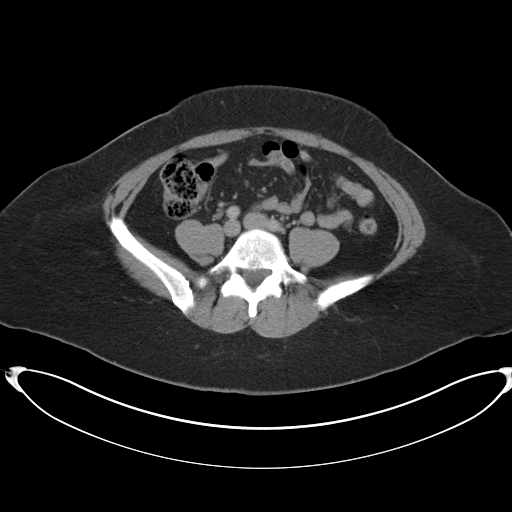
[im 52/93  soft-tissue]
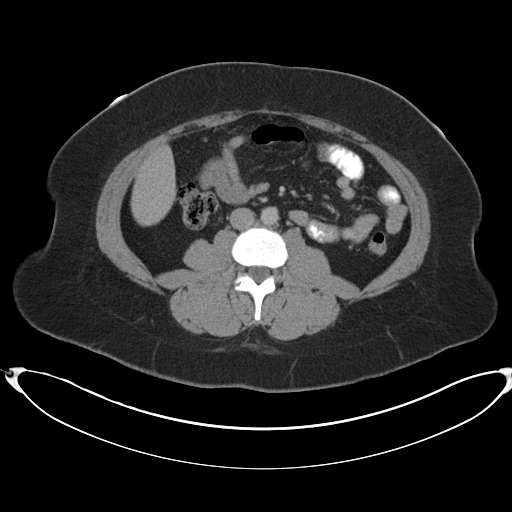
[im 57/93  soft-tissue]
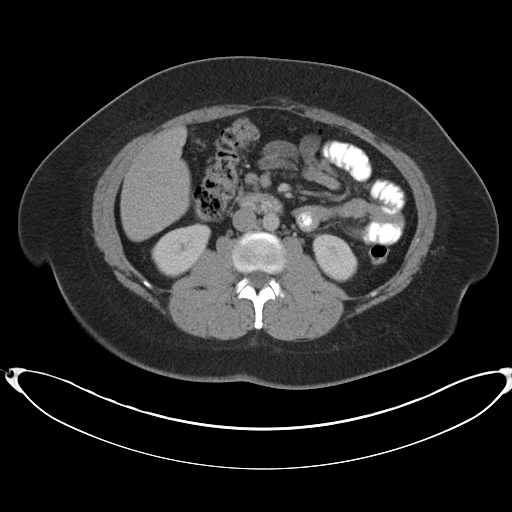
[im 67/93  soft-tissue]
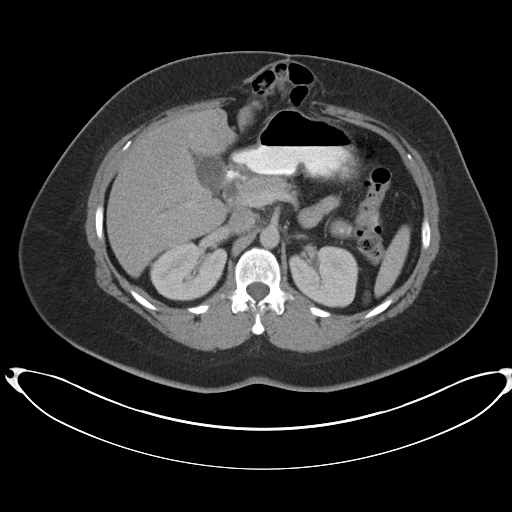
[im 67/93  bone]
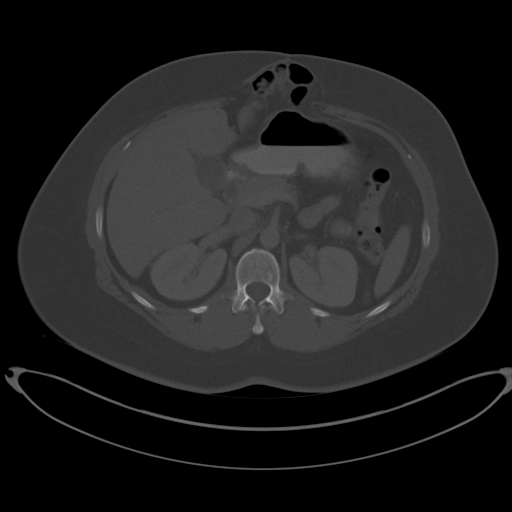
[im 72/93  soft-tissue]
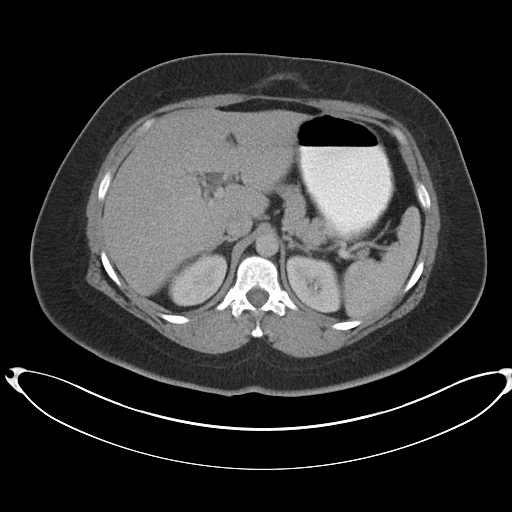
[im 72/93  lung]
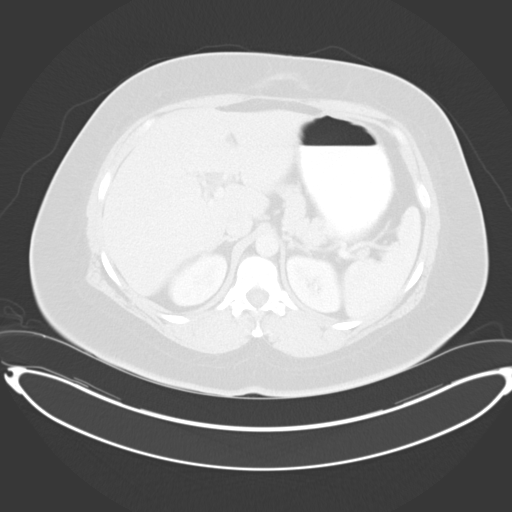
[im 77/93  soft-tissue]
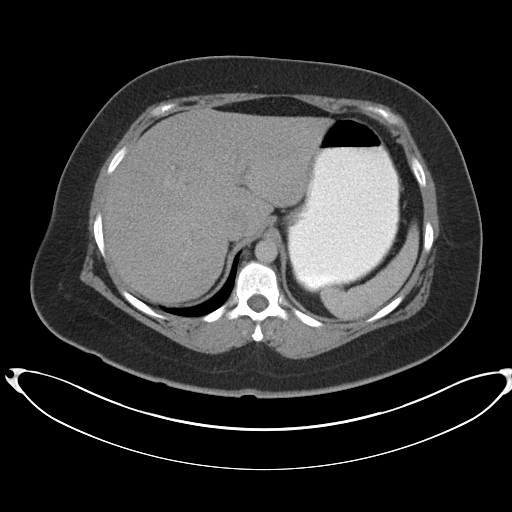
[im 77/93  lung]
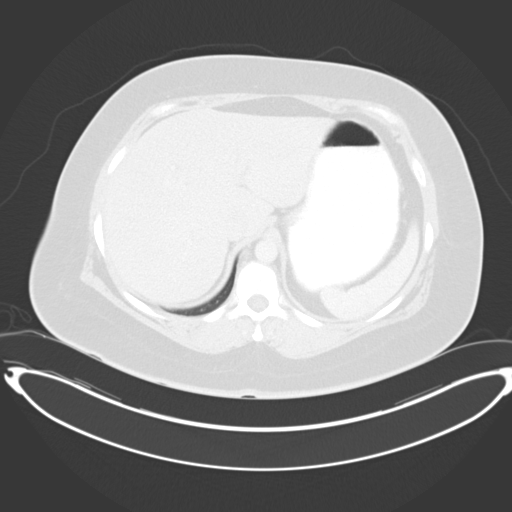
[im 82/93  lung]
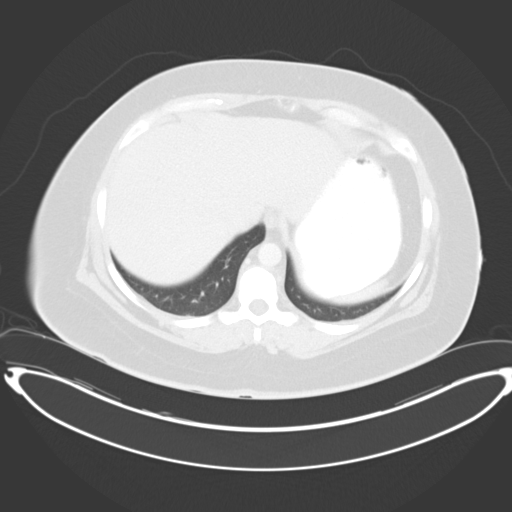
[im 87/93  soft-tissue]
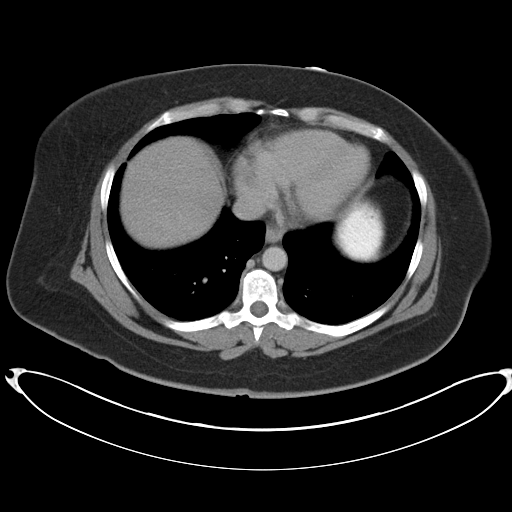
[im 87/93  lung]
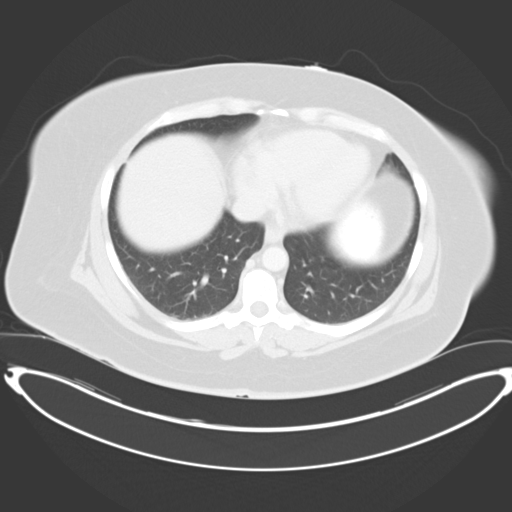

[13 of 32 positions shown; findings below may reference images not displayed]

FINDINGS: Lung bases clear.

Abnormal appearance of the gallbladder with gallbladder wall
thickening and/or pericholecystic fluid. Suggestion of gallstones.
Cholecystitis is a possibility. Ultrasound may be considered for
further delineation.

Enlarged common bile duct without calcified common bile duct stone
noted nor evidence of pancreatic obstructing lesion.

Minimal intrahepatic biliary duct prominence. Within the left lobe
of the liver, there is a 9 mm structure most suggestive of a cyst.
Liver is elongated spanning over 22 cm.

The mid transverse colon enters anterior abdominal wall hernia. No
obstruction proximal to this level. No extra luminal bowel
inflammatory process or free intraperitoneal air.

Non contrast filled slightly underdistended views of the urinary
bladder unremarkable. Small follicle/cyst right ovary incidentally
noted.

Mild degenerative changes lower lumbar spine. Mild sacroiliac joint
degenerative changes.

No abdominal aortic aneurysm. No adenopathy.
IMPRESSION: Abnormal appearance of the gallbladder with gallbladder wall
thickening and/or pericholecystic fluid. Suggestion of gallstones.
Cholecystitis is a possibility. Ultrasound may be considered for
further delineation.

Enlarged common bile duct without calcified common bile duct stone
noted nor evidence of pancreatic obstructing lesion.

Minimal intrahepatic biliary duct prominence. Within the left lobe
of the liver, there is a 9 mm structure most suggestive of a cyst.
Liver is elongated spanning over 22 cm.

Mid transverse colon enters anterior abdominal wall hernia. No
obstruction proximal to this level. No extra luminal bowel
inflammatory process or free intraperitoneal air.

These results were called by telephone at the time of interpretation
on 04/18/2013 at [DATE] to Dr. Adlaho , who verbally acknowledged
these results.

## 2015-04-12 IMAGING — NM NM HEPATOBILIARY IMAGE, INC GB
1 series · 12 of 12 positions shown · non-contrast
Comparison: Abdominal ultrasound and abdomen /pelvis CT from
yesterday

RADIOPHARMACEUTICALS:  A.BmSi 9c-JJm Choletec

CLINICAL DATA: Gallstones with nausea and abdominal pain.

EXAM:
NUCLEAR MEDICINE HEPATOBILIARY IMAGING
TECHNIQUE: Sequential images of the abdomen were obtained [DATE] minutes
following intravenous administration of radiopharmaceutical.

[Series 1: hepato · 4.46mm/px · 2 acquisitions, 12 frames shown]
[im 1/2]
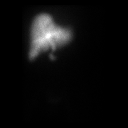
[im 1/2]
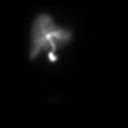
[im 1/2]
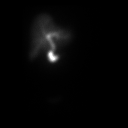
[im 1/2]
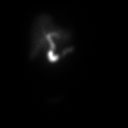
[im 1/2]
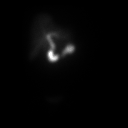
[im 1/2]
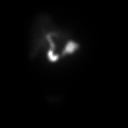
[im 2/2]
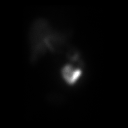
[im 2/2]
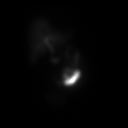
[im 2/2]
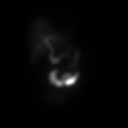
[im 2/2]
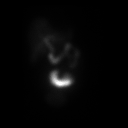
[im 2/2]
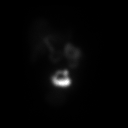
[im 2/2]
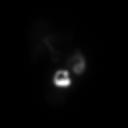

[12 of 12 positions shown; findings below may reference images not displayed]

FINDINGS: There is brisk uptake of radiotracer by the hepatic parenchyma.
Blood pool activity is cleared by 5 min. Biliary activity is seen by
10 min. Common duct and gut activity is also visible by 10 min.
After 60 min of observation, no gallbladder filling was observed.

Patient was given 4.0 mg of intravenous morphine sulfate prior to
additional imaging.

After administration of intravenous morphine, there is continued
excretion of radiotracer by the liver. After an additional 60 min of
observation, no gallbladder filling is observed.
IMPRESSION: No gallbladder filling, even after administration of intravenous
morphine sulfate. Scintigraphic features are consistent with acute
cholecystitis.

I discussed these findings by telephone with Dr. Ryku at

## 2016-05-14 DIAGNOSIS — Z01419 Encounter for gynecological examination (general) (routine) without abnormal findings: Secondary | ICD-10-CM | POA: Diagnosis not present

## 2016-05-14 DIAGNOSIS — Z833 Family history of diabetes mellitus: Secondary | ICD-10-CM | POA: Diagnosis not present

## 2016-05-14 DIAGNOSIS — Z6838 Body mass index (BMI) 38.0-38.9, adult: Secondary | ICD-10-CM | POA: Diagnosis not present

## 2016-05-14 DIAGNOSIS — E559 Vitamin D deficiency, unspecified: Secondary | ICD-10-CM | POA: Diagnosis not present

## 2016-05-14 DIAGNOSIS — Z1231 Encounter for screening mammogram for malignant neoplasm of breast: Secondary | ICD-10-CM | POA: Diagnosis not present

## 2016-05-14 DIAGNOSIS — Z23 Encounter for immunization: Secondary | ICD-10-CM | POA: Diagnosis not present

## 2016-05-15 DIAGNOSIS — S9002XA Contusion of left ankle, initial encounter: Secondary | ICD-10-CM | POA: Diagnosis not present

## 2016-05-15 DIAGNOSIS — M25572 Pain in left ankle and joints of left foot: Secondary | ICD-10-CM | POA: Diagnosis not present

## 2016-05-15 DIAGNOSIS — M25472 Effusion, left ankle: Secondary | ICD-10-CM | POA: Diagnosis not present

## 2016-05-15 DIAGNOSIS — S93492A Sprain of other ligament of left ankle, initial encounter: Secondary | ICD-10-CM | POA: Diagnosis not present

## 2017-08-02 DIAGNOSIS — Z01419 Encounter for gynecological examination (general) (routine) without abnormal findings: Secondary | ICD-10-CM | POA: Diagnosis not present

## 2017-08-02 DIAGNOSIS — Z6839 Body mass index (BMI) 39.0-39.9, adult: Secondary | ICD-10-CM | POA: Diagnosis not present

## 2018-01-13 ENCOUNTER — Ambulatory Visit: Payer: Self-pay | Admitting: General Surgery

## 2018-01-13 DIAGNOSIS — K432 Incisional hernia without obstruction or gangrene: Secondary | ICD-10-CM | POA: Diagnosis not present

## 2018-01-13 NOTE — H&P (Signed)
History of Present Illness Wendy Yoder(Tayler Heiden MD; 01/13/2018 9:57 AM) The patient is a 52 year old female who presents with an incisional hernia. Referred by: Noland FordyceKelly Fogleman, M.D. Chief Complaint: Incisional hernia, recurrent  Patient is a 52 year old female who previously had a open incisional epigastric hernia repaired by Dr. Simona HuhEarl 2010. She states that subsequently she underwent laboratory cholecystectomy by Dr. Ezzard StandingNewman 2015. She states that at that time she was told that she had a epigastric recurrent hernia. Patient states that since that time she's had some discomfort/pain epigastrium. She states this usually associated with eating. She states it can be sharp at times. She states her to radiation. Patient is able to see a bulge. She states it does retract when she relaxes her abdominal wall.  Patient had a previous tummy tuck, cesarean section, and epigastric incisional hernia repair with mesh.    Past Surgical History (Wendy Yoder, RMA; 01/13/2018 9:23 AM) Cesarean Section - 1  Gallbladder Surgery - Laparoscopic  Ventral / Umbilical Hernia Surgery  Bilateral.  Diagnostic Studies History (Wendy Yoder, RMA; 01/13/2018 9:23 AM) Colonoscopy  never Mammogram  1-3 years ago Pap Smear  1-5 years ago  Allergies (Wendy Yoder, RMA; 01/13/2018 9:24 AM) No Known Drug Allergies [01/13/2018]: Allergies Reconciled   Medication History (Wendy Yoder, RMA; 01/13/2018 9:26 AM) Lo lestrin Active. Medications Reconciled  Pregnancy / Birth History (Wendy Yoder, RMA; 01/13/2018 9:23 AM) Age at menarche  14 years. Contraceptive History  Oral contraceptives. Irregular periods  Maternal age  50-20 Para  2  Other Problems (Wendy Yoder, RMA; 01/13/2018 9:23 AM) Hemorrhoids  Migraine Headache  Ventral Hernia Repair     Review of Systems Wendy Yoder([name] MD; 01/13/2018 9:55 AM) Skin Not Present- Change in Wart/Mole, Dryness, Hives, Jaundice, New  Lesions, Non-Healing Wounds, Rash and Ulcer. HEENT Not Present- Earache, Hearing Loss, Hoarseness, Nose Bleed, Oral Ulcers, Ringing in the Ears, Seasonal Allergies, Sinus Pain, Sore Throat, Visual Disturbances, Wears glasses/contact lenses and Yellow Eyes. Respiratory Not Present- Bloody sputum, Chronic Cough, Difficulty Breathing, Snoring and Wheezing. Breast Not Present- Breast Mass, Breast Pain, Nipple Discharge and Skin Changes. Cardiovascular Not Present- Chest Pain, Difficulty Breathing Lying Down, Leg Cramps, Palpitations, Rapid Heart Rate, Shortness of Breath and Swelling of Extremities. Gastrointestinal Present- Abdominal Pain. Not Present- Bloating, Bloody Stool, Change in Bowel Habits, Chronic diarrhea, Constipation, Difficulty Swallowing, Excessive gas, Gets full quickly at meals, Hemorrhoids, Indigestion, Nausea, Rectal Pain and Vomiting. Female Genitourinary Not Present- Frequency, Nocturia, Painful Urination, Pelvic Pain and Urgency. Musculoskeletal Not Present- Back Pain, Joint Pain, Joint Stiffness, Muscle Pain, Muscle Weakness and Swelling of Extremities. Neurological Not Present- Decreased Memory, Fainting, Headaches, Numbness, Seizures, Tingling, Tremor, Trouble walking and Weakness. Psychiatric Not Present- Anxiety, Bipolar, Change in Sleep Pattern, Depression, Fearful and Frequent crying. Endocrine Not Present- Cold Intolerance, Excessive Hunger, Hair Changes, Heat Intolerance, Hot flashes and New Diabetes. Hematology Not Present- Blood Thinners, Easy Bruising, Excessive bleeding, Gland problems, HIV and Persistent Infections. All other systems negative  Vitals (Wendy A. Ginther RMA; 01/13/2018 9:24 AM) 01/13/2018 9:23 AM Weight: 233.2 lb Height: 63in Body Surface Area: 2.06 m Body Mass Index: 41.31 kg/m  Temp.: 98.35F  Pulse: 88 (Regular)  BP: 118/76 (Sitting, Left Arm, Standard)       Physical Exam Wendy Yoder(Adam Demary MD; 01/13/2018 9:58 AM) The physical  exam findings are as follows: Note:Constitutional: No acute distress, conversant, appears stated age  Eyes: Anicteric sclerae, moist conjunctiva, no lid lag  Neck: No thyromegaly, trachea midline,  no cervical lymphadenopathy  Lungs: Clear to auscultation biilaterally, normal respiratory effot  Cardiovascular: regular rate & rhythm, no murmurs, no peripheal edema, pedal pulses 2+  GI: Soft, no masses or hepatosplenomegaly, non-tender to palpation  MSK: Normal gait, no clubbing cyanosis, edema  Skin: No rashes, palpation reveals normal skin turgor  Psychiatric: Appropriate judgment and insight, oriented to person, place, and time  Abdomen Inspection Hernias - Incisional - Reducible(In the epigastrium approximately 4-5 cm in width, reducible, per CT scan 2014 had incarcerated transverse colon.).    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 1. Patient would like to proceed to the operative for open incisional hernia repair with mesh, likely retrorectus. 2. All risks and benefits were discussed with the patient to generally include, but not limited to: infection, bleeding, damage to surrounding structures, acute and chronic nerve pain, and recurrence. Alternatives were offered and described. All questions were answered and the patient voiced understanding of the procedure and wishes to proceed at this point with hernia repair. Current Plans

## 2019-07-06 DIAGNOSIS — Z131 Encounter for screening for diabetes mellitus: Secondary | ICD-10-CM | POA: Diagnosis not present

## 2019-07-06 DIAGNOSIS — Z13220 Encounter for screening for lipoid disorders: Secondary | ICD-10-CM | POA: Diagnosis not present

## 2019-07-06 DIAGNOSIS — Z1329 Encounter for screening for other suspected endocrine disorder: Secondary | ICD-10-CM | POA: Diagnosis not present

## 2019-07-06 DIAGNOSIS — Z0000 Encounter for general adult medical examination without abnormal findings: Secondary | ICD-10-CM | POA: Diagnosis not present

## 2019-08-15 DIAGNOSIS — Z01419 Encounter for gynecological examination (general) (routine) without abnormal findings: Secondary | ICD-10-CM | POA: Diagnosis not present

## 2019-08-15 DIAGNOSIS — Z6836 Body mass index (BMI) 36.0-36.9, adult: Secondary | ICD-10-CM | POA: Diagnosis not present

## 2019-08-15 DIAGNOSIS — Z1231 Encounter for screening mammogram for malignant neoplasm of breast: Secondary | ICD-10-CM | POA: Diagnosis not present

## 2019-09-12 DIAGNOSIS — Z1212 Encounter for screening for malignant neoplasm of rectum: Secondary | ICD-10-CM | POA: Diagnosis not present

## 2019-09-12 DIAGNOSIS — Z1211 Encounter for screening for malignant neoplasm of colon: Secondary | ICD-10-CM | POA: Diagnosis not present

## 2022-01-03 HISTORY — PX: FRACTURE SURGERY: SHX138

## 2023-01-28 ENCOUNTER — Ambulatory Visit: Payer: Self-pay | Admitting: Surgery

## 2023-01-28 NOTE — H&P (Signed)
Subjective    Chief Complaint: Hernia       History of Present Illness: Wendy Yoder is a 57 y.o. female who is seen today as an office consultation for evaluation of Hernia .     This is a 57 year old female with a history of an open epigastric hernia repair by Dr. Colin Benton in 2010.  At that time, she had a 2 cm defect that was repaired with onlay mesh over the fascial closure.  This mesh was Prolene mesh.   In 2014,, the patient was admitted with acute cholecystitis.  She underwent laparoscopic cholecystectomy with intraoperative cholangiogram by Dr. Ovidio Kin.  He describes seeing a recurrent ventral hernia in the epigastrium with transverse colon involved.  The patient was then referred to Dr. Derrell Lolling in 2019 for this epigastric hernia.  He recommended open repair with mesh.  The patient chose not to have the hernia repaired.  Recently, she has noticed more issues with constipation.  The palpable bulge does not appear to be larger but she does feel more fullness in her epigastrium.  Sometimes, she can feel movement in this area after eating a meal.  After discussion with friends, she decided to return to our office for evaluation for hernia repair.   Review of Systems: A complete review of systems was obtained from the patient.  I have reviewed this information and discussed as appropriate with the patient.  See HPI as well for other ROS.   Review of Systems  Constitutional: Negative.   HENT: Negative.    Eyes: Negative.   Respiratory: Negative.    Cardiovascular: Negative.   Gastrointestinal:  Positive for abdominal pain and constipation.  Genitourinary: Negative.   Musculoskeletal: Negative.   Skin: Negative.   Neurological: Negative.   Endo/Heme/Allergies: Negative.   Psychiatric/Behavioral: Negative.          Medical History: Past Medical History  History reviewed. No pertinent past medical history.     Problem List     Patient Active Problem List  Diagnosis    Recurrent ventral incisional hernia        Past Surgical History       Past Surgical History:  Procedure Laterality Date   ABDOMINOPLASTY       CESAREAN SECTION       CHOLECYSTECTOMY       HERNIA REPAIR            Allergies      Allergies  Allergen Reactions   Betadine [Povidone-Iodine] Rash        Medications Ordered Prior to Encounter        Current Outpatient Medications on File Prior to Visit  Medication Sig Dispense Refill   cholecalciferol (VITAMIN D3) 1000 unit capsule Take 1,000 Units by mouth once daily       docosahexaenoic acid/epa (FISH OIL ORAL) Take by mouth       multivitamin tablet Take 1 tablet by mouth once daily        No current facility-administered medications on file prior to visit.        Family History       Family History  Problem Relation Age of Onset   Diabetes Mother     High blood pressure (Hypertension) Father     High blood pressure (Hypertension) Sister     Diabetes Sister          Tobacco Use History  Social History       Tobacco Use  Smoking Status Never  Smokeless Tobacco Never        Social History  Social History        Socioeconomic History   Marital status: Married  Tobacco Use   Smoking status: Never   Smokeless tobacco: Never  Substance and Sexual Activity   Alcohol use: Never   Drug use: Never        Objective:          Vitals:    01/28/23 1504 01/28/23 1505  Pulse: 84    Temp: 36.7 C (98 F)    SpO2: 98%    Weight: (!) 102.6 kg (226 lb 3.2 oz)    Height: 160 cm (5\' 3" )    PainSc:     2  PainLoc:   Abdomen    Body mass index is 40.07 kg/m.   Physical Exam    Constitutional:  WDWN in NAD, conversant, no obvious deformities; lying in bed comfortably Eyes:  Pupils equal, round; sclera anicteric; moist conjunctiva; no lid lag HENT:  Oral mucosa moist; good dentition  Neck:  No masses palpated, trachea midline; no thyromegaly Lungs:  CTA bilaterally; normal respiratory effort CV:   Regular rate and rhythm; no murmurs; extremities well-perfused with no edema Abd:  +bowel sounds, soft, non-tender, no palpable organomegaly; healed upper midline incision.  Underneath this incision, there is a palpable 5 cm hernia sac.  When she is supine this is reducible.  It enlarges with Valsalva maneuver.  The defect is approximately 4 cm to palpation. Musc: Normal gait; no apparent clubbing or cyanosis in extremities Lymphatic:  No palpable cervical or axillary lymphadenopathy Skin:  Warm, dry; no sign of jaundice Psychiatric - alert and oriented x 4; calm mood and affect       Assessment and Plan:  Diagnoses and all orders for this visit:   Recurrent ventral incisional hernia     Recommend open repair of recurrent ventral incisional hernia with retrorectus underlay mesh.The surgical procedure has been discussed with the patient.  Potential risks, benefits, alternative treatments, and expected outcomes have been explained.  All of the patient's questions at this time have been answered.  The likelihood of reaching the patient's treatment goal is good.  The patient understand the proposed surgical procedure and wishes to proceed.     The patient's son is getting married in early November.  She wants to wait until after the wedding to have her surgery.  We discussed the possible risk of incarceration or strangulation.  I showed her how to reduce her hernia.  She will call us about 1 month before she wants to have her surgery performed.       Carlisle Enke Delbert Harness, MD  01/28/2023 5:18 PM

## 2023-10-19 NOTE — Progress Notes (Signed)
 Surgery orders requested via Epic inbox.

## 2023-10-20 ENCOUNTER — Ambulatory Visit: Payer: Self-pay | Admitting: Surgery

## 2023-10-25 NOTE — Patient Instructions (Signed)
 SURGICAL WAITING ROOM VISITATION Patients having surgery or a procedure may have no more than 2 support people in the waiting area - these visitors may rotate in the visitor waiting room.   If the patient needs to stay at the hospital during part of their recovery, the visitor guidelines for inpatient rooms apply.  PRE-OP VISITATION  Pre-op nurse will coordinate an appropriate time for 1 support person to accompany the patient in pre-op.  This support person may not rotate.  This visitor will be contacted when the time is appropriate for the visitor to come back in the pre-op area.  Please refer to the [facility] website for the visitor guidelines for Inpatients (after your surgery is over and you are in a regular room).  You are not required to quarantine at this time prior to your surgery. However, you must do this: Hand Hygiene often Do NOT share personal items Notify your provider if you are in close contact with someone who has COVID or you develop fever 100.4 or greater, new onset of sneezing, cough, sore throat, shortness of breath or body aches.  If you test positive for Covid or have been in contact with anyone that has tested positive in the last 10 days please notify you surgeon.    Your procedure is scheduled on: Wednesday  Nov 10, 2023  Report to [facility] Main Entrance: [address] entrance where the [employer] is available.   Report to admitting at:  06:15   AM  Call this number if you have any questions or problems the morning of surgery [phone]  FOLLOW ANY ADDITIONAL PRE OP INSTRUCTIONS YOU RECEIVED FROM YOUR SURGEON'S OFFICE!!!  Do not eat food after Midnight the night prior to your surgery/procedure.  After Midnight you may have the following liquids until  05:30  AM DAY OF SURGERY  Clear Liquid Diet Water Black Coffee (sugar ok, NO MILK/CREAM OR CREAMERS)  Tea (sugar ok, NO MILK/CREAM OR CREAMERS) regular and decaf                              Plain Jell-O  with no fruit (NO RED)                                           Fruit ices (not with fruit pulp, NO RED)                                     Popsicles (NO RED)                                                                  Juice: NO CITRUS JUICES: only apple, WHITE grape, WHITE cranberry Sports drinks like Gatorade or Powerade (NO RED)              Oral Hygiene is also important to reduce your risk of infection.        Remember - BRUSH YOUR TEETH THE MORNING OF SURGERY WITH YOUR REGULAR TOOTHPASTE  Do NOT smoke after Midnight  the night before surgery.  STOP TAKING all Vitamins, Herbs and supplements 1 week before your surgery.   Take ONLY these medicines the morning of surgery with A SIP OF WATER: none  You may not have any metal on your body including hair pins, jewelry, and body piercing  Do not wear make-up, lotions, powders, perfumes or deodorant  Do not wear nail polish including gel and S&S, artificial / acrylic nails, or any other type of covering on natural nails including finger and toenails. If you have artificial nails, gel coating, etc., that needs to be removed by a nail salon, Please have this removed prior to surgery. Not doing so may mean that your surgery could be cancelled or delayed if the Surgeon or anesthesia staff feels like they are unable to monitor you safely.   Do not shave 48 hours prior to surgery to avoid nicks in your skin which may contribute to postoperative infections.   Contacts, Hearing Aids, dentures or bridgework may not be worn into surgery. DENTURES WILL BE REMOVED PRIOR TO SURGERY PLEASE DO NOT APPLY "Poly grip" OR ADHESIVES!!!  Patients discharged on the day of surgery will not be allowed to drive home.  Someone NEEDS to stay with you for the first 24 hours after anesthesia.  Do not bring your home medications to the hospital. The Pharmacy will dispense medications listed on your medication list to you during your admission in  the Hospital.  Special Instructions: Bring a copy of your healthcare power of attorney and living will documents the day of surgery, if you wish to have them scanned into your [facility] Records- EPIC  Please read over the following fact sheets you were given: IF YOU HAVE QUESTIONS ABOUT YOUR PRE-OP INSTRUCTIONS, PLEASE CALL [phone]   [facility] - Preparing for Surgery Before surgery, you can play an important role.  Because skin is not sterile, your skin needs to be as free of germs as possible.  You can reduce the number of germs on your skin by washing with CHG (chlorahexidine gluconate) soap before surgery.  CHG is an antiseptic cleaner which kills germs and bonds with the skin to continue killing germs even after washing. Please DO NOT use if you have an allergy to CHG or antibacterial soaps.  If your skin becomes reddened/irritated stop using the CHG and inform your nurse when you arrive at Short Stay. Do not shave (including legs and underarms) for at least 48 hours prior to the first CHG shower.  You may shave your face/neck.  Please follow these instructions carefully:  1.  Shower with CHG Soap the night before surgery and the  morning of surgery.  2.  If you choose to wash your hair, wash your hair first as usual with your normal  shampoo.  3.  After you shampoo, rinse your hair and body thoroughly to remove the shampoo.                             4.  Use CHG as you would any other liquid soap.  You can apply chg directly to the skin and wash.  Gently with a scrungie or clean washcloth.  5.  Apply the CHG Soap to your body ONLY FROM THE NECK DOWN.   Do not use on face/ open  Wound or open sores. Avoid contact with eyes, ears mouth and genitals (private parts).                       Wash face,  Genitals (private parts) with your normal soap.             6.  Wash thoroughly, paying special attention to the area where your  surgery  will be  performed.  7.  Thoroughly rinse your body with warm water from the neck down.  8.  DO NOT shower/wash with your normal soap after using and rinsing off the CHG Soap.            9.  Pat yourself dry with a clean towel.            10.  Wear clean pajamas.            11.  Place clean sheets on your bed the night of your first shower and do not  sleep with pets.  ON THE DAY OF SURGERY : Do not apply any lotions/deodorants the morning of surgery.  Please wear clean clothes to the hospital/surgery center.     FAILURE TO FOLLOW THESE INSTRUCTIONS MAY RESULT IN THE CANCELLATION OF YOUR SURGERY  PATIENT SIGNATURE_________________________________  NURSE SIGNATURE__________________________________  ________________________________________________________________________

## 2023-10-25 NOTE — Progress Notes (Addendum)
 COVID Vaccine received:  []  No [x]  Yes Date of any COVID positive Test in last 90 days:  none  PCP -  Hebert Littler, MD at [facility]. Cardiologist - None  Chest x-ray -  EKG -  will do at PST Stress Test -  ECHO -  Cardiac Cath -   Bowel Prep - [x]  No  []   Yes ______  Pacemaker / ICD device [x]  No []  Yes   Spinal Cord Stimulator:[x]  No []  Yes       History of Sleep Apnea? [x]  No []  Yes   CPAP used?- [x]  No []  Yes    Does the patient monitor blood sugar?   [x]  N/A   []  No []  Yes  Patient has: [x]  NO Hx DM   []  Pre-DM   []  DM1  []   DM2  Blood Thinner / Instructions:  none Aspirin Instructions:  none  ERAS Protocol Ordered: []  No  [x]  Yes PRE-SURGERY []  ENSURE  []  G2   [x]  No Drink Ordered Patient is to be NPO after: 0530  Dental hx: []  Dentures:  [x]  N/A      []  Bridge or Partial:                   []  Loose or Damaged teeth:   Activity level: Patient is able to climb a flight of stairs without difficulty; [x]  No CP  [x]  No SOB.  Patient can perform ADLs without assistance.   Anesthesia review: PONV, HTN- no meds, no other pertinent hx.   Patient denies shortness of breath, fever, cough and chest pain at PAT appointment.  Patient verbalized understanding and agreement to the Pre-Surgical Instructions that were given to them at this PAT appointment. Patient was also educated of the need to review these PAT instructions again prior to her surgery.I reviewed the appropriate phone numbers to call if they have any and questions or concerns.

## 2023-10-26 ENCOUNTER — Other Ambulatory Visit: Payer: Self-pay

## 2023-10-26 ENCOUNTER — Encounter (HOSPITAL_COMMUNITY): Payer: Self-pay | Admitting: *Deleted

## 2023-10-26 ENCOUNTER — Encounter (HOSPITAL_COMMUNITY)
Admission: RE | Admit: 2023-10-26 | Discharge: 2023-10-26 | Disposition: A | Discharge status: Home / Self Care | Payer: Self-pay | Source: Ambulatory Visit | Attending: Surgery | Admitting: Surgery

## 2023-10-26 VITALS — BP 140/84 | HR 72 | Temp 98.3°F | Resp 20 | Ht 63.0 in | Wt 230.0 lb

## 2023-10-26 DIAGNOSIS — Z01812 Encounter for preprocedural laboratory examination: Secondary | ICD-10-CM | POA: Diagnosis present

## 2023-10-26 DIAGNOSIS — Z01810 Encounter for preprocedural cardiovascular examination: Secondary | ICD-10-CM | POA: Diagnosis present

## 2023-10-26 DIAGNOSIS — I10 Essential (primary) hypertension: Secondary | ICD-10-CM | POA: Diagnosis not present

## 2023-10-26 DIAGNOSIS — Z01818 Encounter for other preprocedural examination: Secondary | ICD-10-CM | POA: Diagnosis not present

## 2023-10-26 HISTORY — DX: Gastro-esophageal reflux disease without esophagitis: K21.9

## 2023-10-26 HISTORY — DX: Essential (primary) hypertension: I10

## 2023-10-26 LAB — BASIC METABOLIC PANEL WITH GFR
Anion gap: 8 (ref 5–15)
BUN: 22 mg/dL — ABNORMAL HIGH (ref 6–20)
CO2: 23 mmol/L (ref 22–32)
Calcium: 9.5 mg/dL (ref 8.9–10.3)
Chloride: 105 mmol/L (ref 98–111)
Creatinine, Ser: 0.78 mg/dL (ref 0.44–1.00)
GFR, Estimated: >60 mL/min (ref >60)
Glucose, Bld: 101 mg/dL — ABNORMAL HIGH (ref 70–99)
Potassium: 4.3 mmol/L (ref 3.5–5.1)
Sodium: 136 mmol/L (ref 135–145)

## 2023-10-26 LAB — CBC
HCT: 44.4 % (ref 36.0–46.0)
Hemoglobin: 14 g/dL (ref 12.0–15.0)
MCH: 29.2 pg (ref 26.0–34.0)
MCHC: 31.5 g/dL (ref 30.0–36.0)
MCV: 92.5 fL (ref 80.0–100.0)
Platelets: 310 10*3/uL (ref 150–400)
RBC: 4.8 MIL/uL (ref 3.87–5.11)
RDW: 13.9 % (ref 11.5–15.5)
WBC: 10.9 10*3/uL — ABNORMAL HIGH (ref 4.0–10.5)
nRBC: 0 % (ref 0.0–0.2)

## 2023-11-09 NOTE — Anesthesia Preprocedure Evaluation (Signed)
 Anesthesia Evaluation  Patient identified by MRN, date of birth, ID band Patient awake    Reviewed: Allergy & Precautions, H&P , NPO status , Patient's Chart, lab work & pertinent test results  History of Anesthesia Complications (+) PONV and history of anesthetic complications  Airway Mallampati: III  TM Distance: >3 FB Neck ROM: Full    Dental no notable dental hx. (+) Dental Advisory Given, Teeth Intact   Pulmonary neg pulmonary ROS   Pulmonary exam normal breath sounds clear to auscultation       Cardiovascular hypertension, Normal cardiovascular exam Rhythm:Regular Rate:Normal     Neuro/Psych negative neurological ROS  negative psych ROS   GI/Hepatic Neg liver ROS,GERD  ,,  Endo/Other    Class 3 obesity  Renal/GU negative Renal ROS     Musculoskeletal negative musculoskeletal ROS (+)    Abdominal  (+) + obese  Peds negative pediatric ROS (+)  Hematology negative hematology ROS (+)   Anesthesia Other Findings   Reproductive/Obstetrics negative OB ROS                             Anesthesia Physical Anesthesia Plan  ASA: 3  Anesthesia Plan: General   Post-op Pain Management: Tylenol  PO (pre-op)* and Gabapentin PO (pre-op)*   Induction: Intravenous  PONV Risk Score and Plan: 4 or greater and Ondansetron , Dexamethasone , Treatment may vary due to age or medical condition, Midazolam , Propofol  infusion, TIVA and Scopolamine  patch - Pre-op  Airway Management Planned: Oral ETT  Additional Equipment:   Intra-op Plan:   Post-operative Plan: Extubation in OR  Informed Consent: I have reviewed the patients History and Physical, chart, labs and discussed the procedure including the risks, benefits and alternatives for the proposed anesthesia with the patient or authorized representative who has indicated his/her understanding and acceptance.     Dental advisory given  Plan  Discussed with: CRNA  Anesthesia Plan Comments:         Anesthesia Quick Evaluation

## 2023-11-10 ENCOUNTER — Encounter (HOSPITAL_COMMUNITY)
Admission: RE | Disposition: A | Discharge status: Home / Self Care | Payer: Self-pay | Source: Ambulatory Visit | Attending: Surgery

## 2023-11-10 ENCOUNTER — Other Ambulatory Visit: Payer: Self-pay

## 2023-11-10 ENCOUNTER — Ambulatory Visit (HOSPITAL_COMMUNITY)
Admission: RE | Admit: 2023-11-10 | Discharge: 2023-11-11 | Disposition: A | Discharge status: Home / Self Care | Payer: Self-pay | Source: Ambulatory Visit | Attending: Surgery | Admitting: Surgery

## 2023-11-10 ENCOUNTER — Encounter (HOSPITAL_COMMUNITY): Payer: Self-pay | Admitting: Surgery

## 2023-11-10 ENCOUNTER — Ambulatory Visit (HOSPITAL_COMMUNITY): Payer: Self-pay | Admitting: Anesthesiology

## 2023-11-10 DIAGNOSIS — I10 Essential (primary) hypertension: Secondary | ICD-10-CM | POA: Insufficient documentation

## 2023-11-10 DIAGNOSIS — K432 Incisional hernia without obstruction or gangrene: Secondary | ICD-10-CM | POA: Diagnosis present

## 2023-11-10 DIAGNOSIS — E66813 Obesity, class 3: Secondary | ICD-10-CM | POA: Diagnosis not present

## 2023-11-10 DIAGNOSIS — K219 Gastro-esophageal reflux disease without esophagitis: Secondary | ICD-10-CM | POA: Diagnosis not present

## 2023-11-10 DIAGNOSIS — K5900 Constipation, unspecified: Secondary | ICD-10-CM | POA: Diagnosis not present

## 2023-11-10 DIAGNOSIS — Z6841 Body Mass Index (BMI) 40.0 and over, adult: Secondary | ICD-10-CM | POA: Insufficient documentation

## 2023-11-10 HISTORY — PX: VENTRAL HERNIA REPAIR: SHX424

## 2023-11-10 SURGERY — REPAIR, HERNIA, VENTRAL
Anesthesia: General

## 2023-11-10 MED ORDER — ONDANSETRON HCL 4 MG/2ML IJ SOLN
INTRAMUSCULAR | Status: AC
  Filled 2023-11-10: qty 2

## 2023-11-10 MED ORDER — ACETAMINOPHEN 500 MG PO TABS
1000.0000 mg | ORAL_TABLET | ORAL | Status: AC
  Administered 2023-11-10: 1000 mg via ORAL
  Filled 2023-11-10: qty 2

## 2023-11-10 MED ORDER — LABETALOL HCL 5 MG/ML IV SOLN
INTRAVENOUS | Status: AC
  Filled 2023-11-10: qty 4

## 2023-11-10 MED ORDER — CHLORHEXIDINE GLUCONATE CLOTH 2 % EX PADS: 6.0000 | MEDICATED_PAD | Freq: Once | CUTANEOUS | Status: DC

## 2023-11-10 MED ORDER — SCOPOLAMINE 1 MG/3DAYS TD PT72
1.0000 | MEDICATED_PATCH | TRANSDERMAL | Status: DC
Start: 2023-11-10 — End: 2023-11-10
  Administered 2023-11-10: 1.5 mg via TRANSDERMAL
  Filled 2023-11-10: qty 1

## 2023-11-10 MED ORDER — DIPHENHYDRAMINE HCL 50 MG/ML IJ SOLN
INTRAMUSCULAR | Status: DC | PRN
  Administered 2023-11-10: 25 mg via INTRAVENOUS

## 2023-11-10 MED ORDER — KETOROLAC TROMETHAMINE 30 MG/ML IJ SOLN
INTRAMUSCULAR | Status: AC
  Filled 2023-11-10: qty 1

## 2023-11-10 MED ORDER — MIDAZOLAM HCL 5 MG/5ML IJ SOLN
INTRAMUSCULAR | Status: DC | PRN
  Administered 2023-11-10: 2 mg via INTRAVENOUS

## 2023-11-10 MED ORDER — DIPHENHYDRAMINE HCL 50 MG/ML IJ SOLN
INTRAMUSCULAR | Status: AC
  Filled 2023-11-10: qty 1

## 2023-11-10 MED ORDER — LIDOCAINE HCL (CARDIAC) PF 100 MG/5ML IV SOSY
PREFILLED_SYRINGE | INTRAVENOUS | Status: DC | PRN
Start: 2023-11-10 — End: 2023-11-10
  Administered 2023-11-10: 100 mg via INTRAVENOUS

## 2023-11-10 MED ORDER — LIDOCAINE HCL (PF) 2 % IJ SOLN
INTRAMUSCULAR | Status: DC | PRN
  Administered 2023-11-10: 1.5 mg/kg/h via INTRADERMAL

## 2023-11-10 MED ORDER — BUPIVACAINE-EPINEPHRINE (PF) 0.25% -1:200000 IJ SOLN
INTRAMUSCULAR | Status: AC
  Filled 2023-11-10: qty 30

## 2023-11-10 MED ORDER — DEXAMETHASONE SODIUM PHOSPHATE 10 MG/ML IJ SOLN
INTRAMUSCULAR | Status: DC | PRN
  Administered 2023-11-10: 8 mg via INTRAVENOUS

## 2023-11-10 MED ORDER — HYDROMORPHONE HCL 1 MG/ML IJ SOLN: 0.2500 mg | INTRAMUSCULAR | Status: DC | PRN

## 2023-11-10 MED ORDER — CEFAZOLIN SODIUM-DEXTROSE 2-4 GM/100ML-% IV SOLN
2.0000 g | INTRAVENOUS | Status: AC
  Administered 2023-11-10: 2 g via INTRAVENOUS
  Filled 2023-11-10: qty 100

## 2023-11-10 MED ORDER — ONDANSETRON HCL 4 MG/2ML IJ SOLN: 4.0000 mg | Freq: Four times a day (QID) | INTRAMUSCULAR | Status: DC | PRN

## 2023-11-10 MED ORDER — OXYCODONE HCL 5 MG PO TABS
5.0000 mg | ORAL_TABLET | ORAL | Status: DC | PRN
  Administered 2023-11-10: 10 mg via ORAL
  Filled 2023-11-10: qty 2

## 2023-11-10 MED ORDER — KETOROLAC TROMETHAMINE 30 MG/ML IJ SOLN
INTRAMUSCULAR | Status: DC | PRN
Start: 2023-11-10 — End: 2023-11-10
  Administered 2023-11-10: 30 mg via INTRAVENOUS

## 2023-11-10 MED ORDER — FENTANYL CITRATE (PF) 100 MCG/2ML IJ SOLN
INTRAMUSCULAR | Status: DC | PRN
  Administered 2023-11-10: 25 ug via INTRAVENOUS
  Administered 2023-11-10: 75 ug via INTRAVENOUS

## 2023-11-10 MED ORDER — ONDANSETRON 4 MG PO TBDP: 4.0000 mg | ORAL_TABLET | Freq: Four times a day (QID) | ORAL | Status: DC | PRN

## 2023-11-10 MED ORDER — GLYCOPYRROLATE 0.2 MG/ML IJ SOLN
INTRAMUSCULAR | Status: DC | PRN
  Administered 2023-11-10: .2 mg via INTRAVENOUS

## 2023-11-10 MED ORDER — TRAMADOL HCL 50 MG PO TABS: 50.0000 mg | ORAL_TABLET | Freq: Four times a day (QID) | ORAL | Status: DC | PRN

## 2023-11-10 MED ORDER — BUPIVACAINE-EPINEPHRINE 0.25% -1:200000 IJ SOLN
INTRAMUSCULAR | Status: DC | PRN
  Administered 2023-11-10: 20 mL

## 2023-11-10 MED ORDER — ROCURONIUM BROMIDE 100 MG/10ML IV SOLN
INTRAVENOUS | Status: DC | PRN
Start: 2023-11-10 — End: 2023-11-10
  Administered 2023-11-10: 60 mg via INTRAVENOUS

## 2023-11-10 MED ORDER — PROPOFOL 10 MG/ML IV BOLUS
INTRAVENOUS | Status: AC
  Filled 2023-11-10: qty 20

## 2023-11-10 MED ORDER — ONDANSETRON HCL 4 MG/2ML IJ SOLN
INTRAMUSCULAR | Status: DC | PRN
  Administered 2023-11-10: 4 mg via INTRAVENOUS

## 2023-11-10 MED ORDER — ACETAMINOPHEN 500 MG PO TABS: 1000.0000 mg | ORAL_TABLET | Freq: Once | ORAL | Status: DC

## 2023-11-10 MED ORDER — LABETALOL HCL 5 MG/ML IV SOLN
INTRAVENOUS | Status: DC | PRN
  Administered 2023-11-10: 5 mg via INTRAVENOUS

## 2023-11-10 MED ORDER — SUGAMMADEX SODIUM 200 MG/2ML IV SOLN
INTRAVENOUS | Status: DC | PRN
  Administered 2023-11-10: 200 mg via INTRAVENOUS

## 2023-11-10 MED ORDER — MORPHINE SULFATE (PF) 4 MG/ML IV SOLN: 4.0000 mg | INTRAVENOUS | Status: DC | PRN

## 2023-11-10 MED ORDER — ACETAMINOPHEN 650 MG RE SUPP: 650.0000 mg | Freq: Four times a day (QID) | RECTAL | Status: DC | PRN

## 2023-11-10 MED ORDER — KETOROLAC TROMETHAMINE 30 MG/ML IJ SOLN
30.0000 mg | Freq: Four times a day (QID) | INTRAMUSCULAR | Status: DC
  Administered 2023-11-10 – 2023-11-11 (×3): 30 mg via INTRAVENOUS
  Filled 2023-11-10 (×3): qty 1

## 2023-11-10 MED ORDER — DEXAMETHASONE SODIUM PHOSPHATE 10 MG/ML IJ SOLN
INTRAMUSCULAR | Status: AC
  Filled 2023-11-10: qty 1

## 2023-11-10 MED ORDER — ENOXAPARIN SODIUM 40 MG/0.4ML IJ SOSY
40.0000 mg | PREFILLED_SYRINGE | INTRAMUSCULAR | Status: DC
  Administered 2023-11-11: 40 mg via SUBCUTANEOUS
  Filled 2023-11-10: qty 0.4

## 2023-11-10 MED ORDER — GLYCOPYRROLATE 0.2 MG/ML IJ SOLN
INTRAMUSCULAR | Status: AC
  Filled 2023-11-10: qty 1

## 2023-11-10 MED ORDER — LIDOCAINE HCL 2 % IJ SOLN
INTRAMUSCULAR | Status: AC
  Filled 2023-11-10: qty 20

## 2023-11-10 MED ORDER — POTASSIUM CHLORIDE IN NACL 20-0.9 MEQ/L-% IV SOLN
INTRAVENOUS | Status: DC
  Filled 2023-11-10 (×2): qty 1000

## 2023-11-10 MED ORDER — ROCURONIUM BROMIDE 10 MG/ML (PF) SYRINGE
PREFILLED_SYRINGE | INTRAVENOUS | Status: AC
  Filled 2023-11-10: qty 10

## 2023-11-10 MED ORDER — LIDOCAINE HCL (PF) 2 % IJ SOLN
INTRAMUSCULAR | Status: AC
  Filled 2023-11-10: qty 5

## 2023-11-10 MED ORDER — METHOCARBAMOL 500 MG PO TABS: 500.0000 mg | ORAL_TABLET | Freq: Four times a day (QID) | ORAL | Status: DC | PRN

## 2023-11-10 MED ORDER — DIPHENHYDRAMINE HCL 25 MG PO CAPS: 25.0000 mg | ORAL_CAPSULE | Freq: Four times a day (QID) | ORAL | Status: DC | PRN

## 2023-11-10 MED ORDER — MIDAZOLAM HCL 2 MG/2ML IJ SOLN
INTRAMUSCULAR | Status: AC
  Filled 2023-11-10: qty 2

## 2023-11-10 MED ORDER — KETAMINE HCL 50 MG/5ML IJ SOSY
PREFILLED_SYRINGE | INTRAMUSCULAR | Status: DC | PRN
Start: 2023-11-10 — End: 2023-11-10
  Administered 2023-11-10: 20 mg via INTRAVENOUS

## 2023-11-10 MED ORDER — ORAL CARE MOUTH RINSE: 15.0000 mL | Freq: Once | OROMUCOSAL | Status: AC

## 2023-11-10 MED ORDER — 0.9 % SODIUM CHLORIDE (POUR BTL) OPTIME
TOPICAL | Status: DC | PRN
  Administered 2023-11-10: 1000 mL

## 2023-11-10 MED ORDER — FENTANYL CITRATE (PF) 100 MCG/2ML IJ SOLN
INTRAMUSCULAR | Status: AC
  Filled 2023-11-10: qty 2

## 2023-11-10 MED ORDER — DROPERIDOL 2.5 MG/ML IJ SOLN: 0.6250 mg | Freq: Once | INTRAMUSCULAR | Status: DC | PRN

## 2023-11-10 MED ORDER — GABAPENTIN 300 MG PO CAPS
300.0000 mg | ORAL_CAPSULE | Freq: Once | ORAL | Status: AC
  Administered 2023-11-10: 300 mg via ORAL
  Filled 2023-11-10: qty 1

## 2023-11-10 MED ORDER — LACTATED RINGERS IV SOLN: INTRAVENOUS | Status: DC

## 2023-11-10 MED ORDER — DIPHENHYDRAMINE HCL 50 MG/ML IJ SOLN: 25.0000 mg | Freq: Four times a day (QID) | INTRAMUSCULAR | Status: DC | PRN

## 2023-11-10 MED ORDER — ACETAMINOPHEN 325 MG PO TABS: 650.0000 mg | ORAL_TABLET | Freq: Four times a day (QID) | ORAL | Status: DC | PRN

## 2023-11-10 MED ORDER — STERILE WATER FOR IRRIGATION IR SOLN
Status: DC | PRN
  Administered 2023-11-10: 1000 mL

## 2023-11-10 MED ORDER — CHLORHEXIDINE GLUCONATE 0.12 % MT SOLN
15.0000 mL | Freq: Once | OROMUCOSAL | Status: AC
  Administered 2023-11-10: 15 mL via OROMUCOSAL

## 2023-11-10 MED ORDER — KETAMINE HCL 50 MG/5ML IJ SOSY
PREFILLED_SYRINGE | INTRAMUSCULAR | Status: AC
  Filled 2023-11-10: qty 5

## 2023-11-10 MED ORDER — PROPOFOL 10 MG/ML IV BOLUS
INTRAVENOUS | Status: DC | PRN
  Administered 2023-11-10: 80 mg via INTRAVENOUS
  Administered 2023-11-10: 60 mg via INTRAVENOUS
  Administered 2023-11-10: 200 mg via INTRAVENOUS

## 2023-11-10 SURGICAL SUPPLY — 34 items
BAG COUNTER SPONGE SURGICOUNT (BAG) IMPLANT
BENZOIN TINCTURE PRP APPL 2/3 (GAUZE/BANDAGES/DRESSINGS) ×2 IMPLANT
BINDER ABDOMINAL 12 ML 46-62 (SOFTGOODS) IMPLANT
BLADE HEX COATED 2.75 (ELECTRODE) ×2 IMPLANT
BLADE SURG 15 STRL LF DISP TIS (BLADE) ×2 IMPLANT
CHLORAPREP W/TINT 26 (MISCELLANEOUS) ×2 IMPLANT
COVER SURGICAL LIGHT HANDLE (MISCELLANEOUS) ×2 IMPLANT
DERMABOND ADVANCED .7 DNX12 (GAUZE/BANDAGES/DRESSINGS) IMPLANT
DRAPE LAPAROTOMY T 102X78X121 (DRAPES) ×2 IMPLANT
DRSG TEGADERM 4X4.75 (GAUZE/BANDAGES/DRESSINGS) ×2 IMPLANT
ELECT REM PT RETURN 15FT ADLT (MISCELLANEOUS) ×2 IMPLANT
GAUZE SPONGE 4X4 12PLY STRL (GAUZE/BANDAGES/DRESSINGS) IMPLANT
GLOVE BIO SURGEON STRL SZ7 (GLOVE) ×2 IMPLANT
GLOVE BIOGEL PI IND STRL 7.5 (GLOVE) ×2 IMPLANT
GOWN STRL REUS W/ TWL LRG LVL3 (GOWN DISPOSABLE) ×2 IMPLANT
KIT BASIN OR (CUSTOM PROCEDURE TRAY) ×2 IMPLANT
KIT TURNOVER KIT A (KITS) IMPLANT
MESH VENTRALEX ST 8CM LRG (Mesh General) IMPLANT
NDL HYPO 22X1.5 SAFETY MO (MISCELLANEOUS) ×2 IMPLANT
NEEDLE HYPO 22X1.5 SAFETY MO (MISCELLANEOUS) ×1 IMPLANT
PACK BASIC VI WITH GOWN DISP (CUSTOM PROCEDURE TRAY) ×2 IMPLANT
PENCIL SMOKE EVACUATOR (MISCELLANEOUS) IMPLANT
SPIKE FLUID TRANSFER (MISCELLANEOUS) ×2 IMPLANT
SPONGE T-LAP 18X18 ~~LOC~~+RFID (SPONGE) IMPLANT
SPONGE T-LAP 4X18 ~~LOC~~+RFID (SPONGE) ×2 IMPLANT
STRIP CLOSURE SKIN 1/2X4 (GAUZE/BANDAGES/DRESSINGS) ×2 IMPLANT
SUT MNCRL AB 4-0 PS2 18 (SUTURE) ×2 IMPLANT
SUT NOVA NAB DX-16 0-1 5-0 T12 (SUTURE) IMPLANT
SUT NOVA NAB GS-21 0 18 T12 DT (SUTURE) IMPLANT
SUT PROLENE 0 CT 2 (SUTURE) IMPLANT
SUT VIC AB 3-0 SH 18 (SUTURE) IMPLANT
SUT VIC AB 3-0 SH 27XBRD (SUTURE) ×2 IMPLANT
SYR CONTROL 10ML LL (SYRINGE) ×2 IMPLANT
TOWEL OR 17X26 10 PK STRL BLUE (TOWEL DISPOSABLE) ×2 IMPLANT

## 2023-11-10 NOTE — Op Note (Signed)
 Recurrent Ventral Incisional Hernia Repair with Mesh Procedure Note  Indications: This is a 58 year old female with a history of an open epigastric hernia repair by Dr. Brant Caldron in 2010.  At that time, she had a 2 cm defect that was repaired with onlay mesh over the fascial closure.  This mesh was Prolene mesh.   In 2014,, the patient was admitted with acute cholecystitis.  She underwent laparoscopic cholecystectomy with intraoperative cholangiogram by Dr. Juanita Norlander.  He describes seeing a recurrent ventral hernia in the epigastrium with transverse colon involved.  The patient was then referred to Dr. Melton Squires in 2019 for this epigastric hernia.  He recommended open repair with mesh.  The patient chose not to have the hernia repaired.  Recently, she has noticed more issues with constipation.  The palpable bulge does not appear to be larger but she does feel more fullness in her epigastrium.  Sometimes, she can feel movement in this area after eating a meal.  After discussion with friends, she decided to return to our office for evaluation for hernia repair.    Pre-operative Diagnosis: Recurrent Ventral incisional hernia  Post-operative Diagnosis: Ventral hernia  Procedure:  Open repair of recurrent ventral incisional hernia with mesh/ excision of old mesh  Surgeon: Rella Cardinal   Assistants: None  Anesthesia: General endotracheal anesthesia  ASA Class: 2  Procedure Details  The patient was seen in the Holding Room. The risks, benefits, complications, treatment options, and expected outcomes were discussed with the patient. The possibilities of reaction to medication, pulmonary aspiration, perforation of viscus, bleeding, recurrent infection, the need for additional procedures, failure to diagnose a condition, and creating a complication requiring transfusion or operation were discussed with the patient. The patient concurred with the proposed plan, giving informed consent.  The site of  surgery properly noted/marked. The patient was taken to the operating room, identified as Wendy Yoder and the procedure verified as ventral hernia repair. A Time Out was held and the above information confirmed.  The patient was placed supine.  After establishing general anesthesia, the abdomen was prepped with Chloraprep and draped in sterile fashion.  We made a vertical incision over the palpable hernia in the epigastrium, using her old scar.  Dissection was carried down to the hernia sac located above the fascia and mobilized from surrounding structures.  The mesh is palpated within the hernia sac.  We opened the hernia sac and excised the old mesh.  We took down the omental adhesions to clear the fascia circumferentially.  The fascial defect is 5.5 cm transversely and 4 cm vertically.  Intact fascia was identified circumferentially around the defect.  We used a round 8 cm Ventralex mesh and secured this to the fascia with interrupted 0 Novofil sutures.  The fascial defect was reapproximated with interrupted figure-of-8 1 Novofil sutures.  The subcutaneous tissues were irrigated.  The skin incision was closed with a deep layer of 3-0 Vicryl.  Hemostasis was confirmed.  The skin incision was closed with a 4-0 Vicryl subcuticular closure.  Dermabond was applied at the end of the operation, due to the patient's adhesive allergy.  An abdominal binder was applied.  Instrument, sponge, and needle counts were correct prior to closure and at the conclusion of the case.   Findings:  Fascial defect 5.5 x 4 cm with detached mesh within the hernia sac  Estimated Blood Loss:  Minimal         Drains: none  Complications:  None; patient tolerated the procedure well.         Disposition: PACU - hemodynamically stable.         Condition: stable  Wendy Yoder. Eli Grizzle, MD, [facility]  General Surgery   11/10/2023 10:23 AM

## 2023-11-10 NOTE — H&P (Signed)
 Subjective    Chief Complaint: Hernia       History of Present Illness: Wendy Yoder is a 58y.o. female who is seen today as an office consultation for evaluation of Hernia .     This is a 58 year old female with a history of an open epigastric hernia repair by Dr. Brant Caldron in 2010.  At that time, she had a 2 cm defect that was repaired with onlay mesh over the fascial closure.  This mesh was Prolene mesh.   In 2014,, the patient was admitted with acute cholecystitis.  She underwent laparoscopic cholecystectomy with intraoperative cholangiogram by Dr. Juanita Norlander.  He describes seeing a recurrent ventral hernia in the epigastrium with transverse colon involved.  The patient was then referred to Dr. Melton Squires in 2019 for this epigastric hernia.  He recommended open repair with mesh.  The patient chose not to have the hernia repaired.  Recently, she has noticed more issues with constipation.  The palpable bulge does not appear to be larger but she does feel more fullness in her epigastrium.  Sometimes, she can feel movement in this area after eating a meal.  After discussion with friends, she decided to return to our office for evaluation for hernia repair.   Review of Systems: A complete review of systems was obtained from the patient.  I have reviewed this information and discussed as appropriate with the patient.  See HPI as well for other ROS.   Review of Systems  Constitutional: Negative.   HENT: Negative.    Eyes: Negative.   Respiratory: Negative.    Cardiovascular: Negative.   Gastrointestinal:  Positive for abdominal pain and constipation.  Genitourinary: Negative.   Musculoskeletal: Negative.   Skin: Negative.   Neurological: Negative.   Endo/Heme/Allergies: Negative.   Psychiatric/Behavioral: Negative.          Medical History: Past Medical History  History reviewed. No pertinent past medical history.      Problem List       Patient Active Problem List  Diagnosis    Recurrent ventral incisional hernia        Past Surgical History           Past Surgical History:  Procedure Laterality Date   ABDOMINOPLASTY       CESAREAN SECTION       CHOLECYSTECTOMY       HERNIA REPAIR            Allergies         Allergies  Allergen Reactions   Betadine [Povidone-Iodine] Rash        Medications Ordered Prior to Encounter             Current Outpatient Medications on File Prior to Visit  Medication Sig Dispense Refill   cholecalciferol (VITAMIN D3) 1000 unit capsule Take 1,000 Units by mouth once daily       docosahexaenoic acid/epa (FISH OIL ORAL) Take by mouth       multivitamin tablet Take 1 tablet by mouth once daily        No current facility-administered medications on file prior to visit.        Family History           Family History  Problem Relation Age of Onset   Diabetes Mother     High blood pressure (Hypertension) Father     High blood pressure (Hypertension) Sister     Diabetes Sister          Tobacco  Use History  Social History         Tobacco Use  Smoking Status Never  Smokeless Tobacco Never        Social History  Social History           Socioeconomic History   Marital status: Married  Tobacco Use   Smoking status: Never   Smokeless tobacco: Never  Substance and Sexual Activity   Alcohol use: Never   Drug use: Never        Objective:             Vitals:    01/28/23 1504 01/28/23 1505  Pulse: 84    Temp: 36.7 C (98 F)    SpO2: 98%    Weight: (!) 102.6 kg (226 lb 3.2 oz)    Height: 160 cm (5\' 3" )    PainSc:     2  PainLoc:   Abdomen    Body mass index is 40.07 kg/m.   Physical Exam    Constitutional:  WDWN in NAD, conversant, no obvious deformities; lying in bed comfortably Eyes:  Pupils equal, round; sclera anicteric; moist conjunctiva; no lid lag HENT:  Oral mucosa moist; good dentition  Neck:  No masses palpated, trachea midline; no thyromegaly Lungs:  CTA bilaterally; normal  respiratory effort CV:  Regular rate and rhythm; no murmurs; extremities well-perfused with no edema Abd:  +bowel sounds, soft, non-tender, no palpable organomegaly; healed upper midline incision.  Underneath this incision, there is a palpable 5 cm hernia sac.  When she is supine this is reducible.  It enlarges with Valsalva maneuver.  The defect is approximately 4 cm to palpation. Musc: Normal gait; no apparent clubbing or cyanosis in extremities Lymphatic:  No palpable cervical or axillary lymphadenopathy Skin:  Warm, dry; no sign of jaundice Psychiatric - alert and oriented x 4; calm mood and affect       Assessment and Plan:  Diagnoses and all orders for this visit:   Recurrent ventral incisional hernia     Recommend open repair of recurrent ventral incisional hernia with retrorectus underlay mesh.The surgical procedure has been discussed with the patient.  Potential risks, benefits, alternative treatments, and expected outcomes have been explained.  All of the patient's questions at this time have been answered.  The likelihood of reaching the patient's treatment goal is good.  The patient understand the proposed surgical procedure and wishes to proceed.  [name]. Eli Grizzle, MD, [facility]  General Surgery   11/10/2023 8:08 AM

## 2023-11-10 NOTE — Anesthesia Procedure Notes (Signed)
 Procedure Name: Intubation Date/Time: 11/10/2023 8:35 AM  Performed by: Carolynn Citrin, CRNAPre-anesthesia Checklist: Patient identified, Emergency Drugs available, Suction available, Patient being monitored and Timeout performed Patient Re-evaluated:Patient Re-evaluated prior to induction Oxygen Delivery Method: Circle system utilized Preoxygenation: Pre-oxygenation with 100% oxygen Induction Type: IV induction Ventilation: Mask ventilation without difficulty Laryngoscope Size: Mac and 4 Grade View: Grade II Tube type: Oral Tube size: 7.0 mm Number of attempts: 1 Airway Equipment and Method: Patient positioned with wedge pillow and Stylet Placement Confirmation: ETT inserted through vocal cords under direct vision, positive ETCO2 and breath sounds checked- equal and bilateral Secured at: 21 cm Tube secured with: Tape Dental Injury: Teeth and Oropharynx as per pre-operative assessment

## 2023-11-10 NOTE — Plan of Care (Signed)
   Problem: Education: Goal: Knowledge of General Education information will improve Description Including pain rating scale, medication(s)/side effects and non-pharmacologic comfort measures Outcome: Progressing

## 2023-11-10 NOTE — Transfer of Care (Signed)
 Immediate Anesthesia Transfer of Care Note  Patient: Wendy Yoder  Procedure(s) Performed: REPAIR, HERNIA, VENTRAL  Patient Location: PACU  Anesthesia Type:General  Level of Consciousness: drowsy and patient cooperative  Airway & Oxygen Therapy: Patient Spontanous Breathing and Patient connected to face mask oxygen  Post-op Assessment: Report given to RN and Post -op Vital signs reviewed and stable  Post vital signs: Reviewed and stable  Last Vitals:  Vitals Value Taken Time  BP 118/86 11/10/23 1030  Temp 97   Pulse 86 11/10/23 1031  Resp 16   SpO2 97 % 11/10/23 1031  Vitals shown include unfiled device data.  Last Pain:  Vitals:   11/10/23 0728  TempSrc:   PainSc: 0-No pain      Patients Stated Pain Goal: 5 (11/10/23 0721)  Complications: No notable events documented.

## 2023-11-11 ENCOUNTER — Other Ambulatory Visit (HOSPITAL_COMMUNITY): Payer: Self-pay

## 2023-11-11 ENCOUNTER — Encounter (HOSPITAL_COMMUNITY): Payer: Self-pay | Admitting: Surgery

## 2023-11-11 DIAGNOSIS — K432 Incisional hernia without obstruction or gangrene: Secondary | ICD-10-CM | POA: Diagnosis not present

## 2023-11-11 LAB — CBC
HCT: 38.8 % (ref 36.0–46.0)
Hemoglobin: 12.3 g/dL (ref 12.0–15.0)
MCH: 29.7 pg (ref 26.0–34.0)
MCHC: 31.7 g/dL (ref 30.0–36.0)
MCV: 93.7 fL (ref 80.0–100.0)
Platelets: 296 10*3/uL (ref 150–400)
RBC: 4.14 MIL/uL (ref 3.87–5.11)
RDW: 14.2 % (ref 11.5–15.5)
WBC: 16.1 10*3/uL — ABNORMAL HIGH (ref 4.0–10.5)
nRBC: 0 % (ref 0.0–0.2)

## 2023-11-11 LAB — BASIC METABOLIC PANEL WITH GFR
Anion gap: 10 (ref 5–15)
BUN: 17 mg/dL (ref 6–20)
CO2: 21 mmol/L — ABNORMAL LOW (ref 22–32)
Calcium: 8.9 mg/dL (ref 8.9–10.3)
Chloride: 105 mmol/L (ref 98–111)
Creatinine, Ser: 0.85 mg/dL (ref 0.44–1.00)
GFR, Estimated: >60 mL/min (ref >60)
Glucose, Bld: 104 mg/dL — ABNORMAL HIGH (ref 70–99)
Potassium: 4.1 mmol/L (ref 3.5–5.1)
Sodium: 136 mmol/L (ref 135–145)

## 2023-11-11 MED ORDER — OXYCODONE HCL 5 MG PO TABS
5.0000 mg | ORAL_TABLET | Freq: Four times a day (QID) | ORAL | 0 refills | Status: AC | PRN
Start: 2023-11-11 — End: ?
  Filled 2023-11-11: qty 20, 5d supply, fill #0

## 2023-11-11 NOTE — Discharge Instructions (Signed)
 [facility]__[facility]  HERNIA REPAIR: POST OP INSTRUCTIONS  Always review your discharge instruction sheet given to you by the facility where your surgery was performed. IF YOU HAVE DISABILITY OR FAMILY LEAVE FORMS, YOU MUST BRING THEM TO THE OFFICE FOR PROCESSING.   DO NOT GIVE THEM TO YOUR DOCTOR.  1. A  prescription for pain medication may be given to you upon discharge.  Take your pain medication as prescribed, if needed.  If narcotic pain medicine is not needed, then you may take acetaminophen  (Tylenol ) or ibuprofen  (Advil ) as needed. 2. Take your usually prescribed medications unless otherwise directed. If you need a refill on your pain medication, please contact your pharmacy.  They will contact our office to request authorization. Prescriptions will not be filled after 5 pm or on week-ends. 3. You should follow a light diet the first 24 hours after arrival home, such as soup and crackers, etc.  Be sure to include lots of fluids daily.  Resume your normal diet the day after surgery. 4.Most patients will experience some swelling and bruising around the incision.  Ice packs and reclining will help.  Swelling and bruising can take several days to resolve.  6. It is common to experience some constipation if taking pain medication after surgery.  Increasing fluid intake and taking a stool softener (such as Colace) will usually help or prevent this problem from occurring.  A mild laxative (Milk of Magnesia or Miralax) should be taken according to package directions if there are no bowel movements after 48 hours. 7. Unless discharge instructions indicate otherwise, you may remove your bandages 24-48 hours after surgery, and you may shower at that time.  Your surgeon used skin glue on the incision, so you may shower 24 hours after surgery.  The glue will flake off over the next 2-3 weeks.   8. ACTIVITIES:  You may resume regular (light) daily activities beginning the next day--such as  daily self-care, walking, climbing stairs--gradually increasing activities as tolerated.  You may have sexual intercourse when it is comfortable.  Refrain from any heavy lifting or straining (over 20 lbs) until approved by your doctor.  Wear your abdominal binder whenever you are out of bed, at least for the first 2 weeks.  a.You may drive when you are no longer taking prescription pain medication, you can comfortably wear a seatbelt, and you can safely maneuver your car and apply brakes. b.RETURN TO WORK:   _____________________________________________  9.You should see your doctor in the office for a follow-up appointment approximately 2-3 weeks after your surgery.  Make sure that you call for this appointment within a day or two after you arrive home to insure a convenient appointment time. 10.OTHER INSTRUCTIONS: _________________________    _____________________________________  WHEN TO CALL YOUR DOCTOR: Fever over 101.0 Inability to urinate Nausea and/or vomiting Extreme swelling or bruising Continued bleeding from incision. Increased pain, redness, or drainage from the incision  The clinic staff is available to answer your questions during regular business hours.  Please don't hesitate to call and ask to speak to one of the nurses for clinical concerns.  If you have a medical emergency, go to the nearest emergency room or call 911.  A surgeon from [facility] is always on call at the hospital   [address], [place], [place]  [address] ?  [address] [address], [place], [place]   [address] [phone] ? [phone] ? FAX [phone] Web site: [email or web address]

## 2023-11-11 NOTE — Discharge Summary (Signed)
 Physician Discharge Summary  Patient ID: Wendy Yoder MRN: [id number] DOB/AGE: Nov 23, 1965 58 y.o.  Admit date: 11/10/2023 Discharge date: 11/11/2023  Admission Diagnoses:  Recurrent ventral incisional hernia  Discharge Diagnoses: Same Principal Problem:   Recurrent ventral incisional hernia   Discharged Condition: good  Hospital Course: Open repair of recurrent ventral incisional hernia/ excision of old hernia mesh 11/10/23.  The patient did well overnight with good pain control.  No nausea or vomiting. Hgb stable    Treatments: surgery: see above  Discharge Exam: Blood pressure 121/76, pulse (!) 54, temperature 97.9 F (36.6 C), temperature source Oral, resp. rate 18, height 5\' 3"  (1.6 m), weight 104.3 kg, last menstrual period 03/19/2013, SpO2 94%. WDWN in NAD Abd - soft, incisional tenderness.   Incisions c/d/I with no bruising or hematoma  Disposition: Discharge disposition: 01-Home or Self Care       Discharge Instructions     Call MD for:  persistant nausea and vomiting   Complete by: As directed    Call MD for:  redness, tenderness, or signs of infection (pain, swelling, redness, odor or green/yellow discharge around incision site)   Complete by: As directed    Call MD for:  severe uncontrolled pain   Complete by: As directed    Call MD for:  temperature >100.4   Complete by: As directed    Diet general   Complete by: As directed    Discharge wound care:   Complete by: As directed    You may shower 24 hours after surgery  Wear your abdominal binder whenever you are out of bed for support   Driving Restrictions   Complete by: As directed    Do not drive while taking pain medications   Increase activity slowly   Complete by: As directed    May shower / Bathe   Complete by: As directed       Allergies as of 11/11/2023       Reactions   Betadine [povidone Iodine] Rash   Latex Rash   Patient said she had a rash that entered her blood stream    Tape  Rash   Patient said she had a rash that entered her blood stream         Medication List     STOP taking these medications    HYDROcodone -acetaminophen  5-325 MG tablet Commonly known as: NORCO/VICODIN       TAKE these medications    acetaminophen  325 MG tablet Commonly known as: TYLENOL  Take 2 tablets (650 mg total) by mouth every 6 (six) hours as needed for pain or fever (Do not take more than 4000 mg per day.  This is in your prescribed pain pill.).   cholecalciferol 25 MCG (1000 UNIT) tablet Commonly known as: VITAMIN D3 Take 1,000 Units by mouth daily.   multivitamin with minerals Tabs tablet Take 1 tablet by mouth daily.   oxyCODONE 5 MG immediate release tablet Commonly known as: Oxy IR/ROXICODONE Take 1 tablet (5 mg total) by mouth every 6 (six) hours as needed for moderate pain (pain score 4-6).               Discharge Care Instructions  (From admission, onward)           Start     Ordered   11/11/23 0000  Discharge wound care:       Comments: You may shower 24 hours after surgery  Wear your abdominal binder whenever you are out of bed  for support   11/11/23 1610            Follow-up Information     Dareen Ebbing, MD Follow up in 3 week(s).   Specialty: General Surgery Contact information: [address] [place] [address] [place] [address] [phone]                 Signed: Rella Cardinal 11/11/2023, 7:00 AM

## 2023-11-11 NOTE — Plan of Care (Signed)
  Problem: Education: °Goal: Knowledge of General Education information will improve °Description: Including pain rating scale, medication(s)/side effects and non-pharmacologic comfort measures °Outcome: Progressing °  °Problem: Clinical Measurements: °Goal: Will remain free from infection °Outcome: Progressing °Goal: Diagnostic test results will improve °Outcome: Progressing °  °Problem: Nutrition: °Goal: Adequate nutrition will be maintained °Outcome: Progressing °  °

## 2023-11-11 NOTE — Progress Notes (Signed)
 Discharge instructions given to patient and all questions were answered.

## 2023-11-11 NOTE — Anesthesia Postprocedure Evaluation (Signed)
 Anesthesia Post Note  Patient: Wendy Yoder  Procedure(s) Performed: REPAIR, HERNIA, VENTRAL     Patient location during evaluation: PACU Anesthesia Type: General Level of consciousness: sedated and patient cooperative Pain management: pain level controlled Vital Signs Assessment: post-procedure vital signs reviewed and stable Respiratory status: spontaneous breathing Cardiovascular status: stable Anesthetic complications: no   No notable events documented.  Last Vitals:  Vitals:   11/11/23 0157 11/11/23 0539  BP: 97/72 121/76  Pulse: (!) 57 (!) 54  Resp: 18 18  Temp: 36.5 C 36.6 C  SpO2: 98% 94%    Last Pain:  Vitals:   11/11/23 0745  TempSrc:   PainSc: 2                  Gorman Laughter

## 2023-11-11 NOTE — Progress Notes (Signed)
 TOC med in a secure bag delivered to pt in room by this RN

## 2023-12-23 ENCOUNTER — Other Ambulatory Visit (HOSPITAL_COMMUNITY): Payer: Self-pay
